# Patient Record
Sex: Female | Born: 1975 | Race: White | Hispanic: No | Marital: Single | Smoking: Never smoker
Health system: Southern US, Community
[De-identification: ages and names within clinical notes are randomized; demographics above are authoritative.]

---

## 2007-04-13 ENCOUNTER — Inpatient Hospital Stay (HOSPITAL_COMMUNITY): Admission: AD | Admit: 2007-04-13 | Discharge: 2007-04-13 | Payer: Self-pay | Admitting: Gynecology

## 2007-04-16 ENCOUNTER — Inpatient Hospital Stay (HOSPITAL_COMMUNITY): Admission: AD | Admit: 2007-04-16 | Discharge: 2007-04-16 | Payer: Self-pay | Admitting: Gynecology

## 2007-08-15 ENCOUNTER — Ambulatory Visit (HOSPITAL_COMMUNITY): Admission: RE | Admit: 2007-08-15 | Discharge: 2007-08-15 | Payer: Self-pay | Admitting: Family Medicine

## 2011-01-27 LAB — URINALYSIS, ROUTINE W REFLEX MICROSCOPIC
Ketones, ur: NEGATIVE
Specific Gravity, Urine: >1.03 — ABNORMAL HIGH
Urobilinogen, UA: 0.2
pH: 5.5

## 2011-01-27 LAB — WET PREP, GENITAL: Trich, Wet Prep: NONE SEEN

## 2020-08-16 ENCOUNTER — Other Ambulatory Visit: Payer: Self-pay

## 2020-08-16 ENCOUNTER — Inpatient Hospital Stay (HOSPITAL_COMMUNITY)
Admission: EM | Admit: 2020-08-16 | Discharge: 2020-08-21 | DRG: 417 | Disposition: A | Discharge status: Home / Self Care | Payer: BC Managed Care – PPO | Attending: Internal Medicine | Admitting: Internal Medicine

## 2020-08-16 ENCOUNTER — Emergency Department (HOSPITAL_COMMUNITY): Payer: BC Managed Care – PPO

## 2020-08-16 ENCOUNTER — Encounter (HOSPITAL_COMMUNITY): Payer: Self-pay | Admitting: Emergency Medicine

## 2020-08-16 DIAGNOSIS — J189 Pneumonia, unspecified organism: Secondary | ICD-10-CM

## 2020-08-16 DIAGNOSIS — K8000 Calculus of gallbladder with acute cholecystitis without obstruction: Secondary | ICD-10-CM | POA: Diagnosis not present

## 2020-08-16 DIAGNOSIS — K810 Acute cholecystitis: Secondary | ICD-10-CM | POA: Diagnosis not present

## 2020-08-16 DIAGNOSIS — K8012 Calculus of gallbladder with acute and chronic cholecystitis without obstruction: Secondary | ICD-10-CM | POA: Diagnosis not present

## 2020-08-16 DIAGNOSIS — J9811 Atelectasis: Secondary | ICD-10-CM | POA: Diagnosis not present

## 2020-08-16 DIAGNOSIS — R109 Unspecified abdominal pain: Secondary | ICD-10-CM | POA: Diagnosis not present

## 2020-08-16 DIAGNOSIS — Z20822 Contact with and (suspected) exposure to covid-19: Secondary | ICD-10-CM | POA: Diagnosis not present

## 2020-08-16 DIAGNOSIS — K219 Gastro-esophageal reflux disease without esophagitis: Secondary | ICD-10-CM | POA: Diagnosis present

## 2020-08-16 DIAGNOSIS — D72829 Elevated white blood cell count, unspecified: Secondary | ICD-10-CM

## 2020-08-16 DIAGNOSIS — K828 Other specified diseases of gallbladder: Secondary | ICD-10-CM | POA: Diagnosis present

## 2020-08-16 DIAGNOSIS — K8066 Calculus of gallbladder and bile duct with acute and chronic cholecystitis without obstruction: Secondary | ICD-10-CM | POA: Diagnosis not present

## 2020-08-16 DIAGNOSIS — K858 Other acute pancreatitis without necrosis or infection: Secondary | ICD-10-CM | POA: Diagnosis present

## 2020-08-16 DIAGNOSIS — K567 Ileus, unspecified: Secondary | ICD-10-CM | POA: Diagnosis not present

## 2020-08-16 DIAGNOSIS — K8020 Calculus of gallbladder without cholecystitis without obstruction: Secondary | ICD-10-CM

## 2020-08-16 DIAGNOSIS — K851 Biliary acute pancreatitis without necrosis or infection: Secondary | ICD-10-CM | POA: Diagnosis not present

## 2020-08-16 DIAGNOSIS — K859 Acute pancreatitis without necrosis or infection, unspecified: Secondary | ICD-10-CM | POA: Diagnosis not present

## 2020-08-16 DIAGNOSIS — K760 Fatty (change of) liver, not elsewhere classified: Secondary | ICD-10-CM | POA: Diagnosis not present

## 2020-08-16 DIAGNOSIS — E871 Hypo-osmolality and hyponatremia: Secondary | ICD-10-CM | POA: Diagnosis not present

## 2020-08-16 DIAGNOSIS — R935 Abnormal findings on diagnostic imaging of other abdominal regions, including retroperitoneum: Secondary | ICD-10-CM | POA: Diagnosis not present

## 2020-08-16 DIAGNOSIS — E876 Hypokalemia: Secondary | ICD-10-CM | POA: Diagnosis not present

## 2020-08-16 DIAGNOSIS — K8050 Calculus of bile duct without cholangitis or cholecystitis without obstruction: Secondary | ICD-10-CM | POA: Diagnosis not present

## 2020-08-16 DIAGNOSIS — D72828 Other elevated white blood cell count: Secondary | ICD-10-CM | POA: Diagnosis not present

## 2020-08-16 DIAGNOSIS — K3189 Other diseases of stomach and duodenum: Secondary | ICD-10-CM | POA: Diagnosis not present

## 2020-08-16 LAB — CBC WITH DIFFERENTIAL/PLATELET
Abs Immature Granulocytes: 0.09 10*3/uL — ABNORMAL HIGH (ref 0.00–0.07)
Basophils Absolute: 0 10*3/uL (ref 0.0–0.1)
Basophils Relative: 0 %
Eosinophils Absolute: 0 10*3/uL (ref 0.0–0.5)
Eosinophils Relative: 0 %
HCT: 41.3 % (ref 36.0–46.0)
Hemoglobin: 14.2 g/dL (ref 12.0–15.0)
Immature Granulocytes: 1 %
Lymphocytes Relative: 6 %
Lymphs Abs: 0.9 10*3/uL (ref 0.7–4.0)
MCH: 30.3 pg (ref 26.0–34.0)
MCHC: 34.4 g/dL (ref 30.0–36.0)
MCV: 88.2 fL (ref 80.0–100.0)
Monocytes Absolute: 0.6 10*3/uL (ref 0.1–1.0)
Monocytes Relative: 4 %
Neutro Abs: 13.1 10*3/uL — ABNORMAL HIGH (ref 1.7–7.7)
Neutrophils Relative %: 89 %
Platelets: 298 10*3/uL (ref 150–400)
RBC: 4.68 MIL/uL (ref 3.87–5.11)
RDW: 12.4 % (ref 11.5–15.5)
WBC: 14.7 10*3/uL — ABNORMAL HIGH (ref 4.0–10.5)
nRBC: 0 % (ref 0.0–0.2)

## 2020-08-16 LAB — COMPREHENSIVE METABOLIC PANEL
ALT: 451 U/L — ABNORMAL HIGH (ref 0–44)
AST: 254 U/L — ABNORMAL HIGH (ref 15–41)
Albumin: 4.3 g/dL (ref 3.5–5.0)
Alkaline Phosphatase: 178 U/L — ABNORMAL HIGH (ref 38–126)
Anion gap: 16 — ABNORMAL HIGH (ref 5–15)
BUN: 7 mg/dL (ref 6–20)
CO2: 22 mmol/L (ref 22–32)
Calcium: 9.2 mg/dL (ref 8.9–10.3)
Chloride: 96 mmol/L — ABNORMAL LOW (ref 98–111)
Creatinine, Ser: 0.72 mg/dL (ref 0.44–1.00)
GFR, Estimated: >60 mL/min (ref >60)
Glucose, Bld: 155 mg/dL — ABNORMAL HIGH (ref 70–99)
Potassium: 3.5 mmol/L (ref 3.5–5.1)
Sodium: 134 mmol/L — ABNORMAL LOW (ref 135–145)
Total Bilirubin: 6.8 mg/dL — ABNORMAL HIGH (ref 0.3–1.2)
Total Protein: 7.5 g/dL (ref 6.5–8.1)

## 2020-08-16 LAB — URINALYSIS, ROUTINE W REFLEX MICROSCOPIC
Glucose, UA: NEGATIVE mg/dL
Ketones, ur: 20 mg/dL — AB
Nitrite: NEGATIVE
Protein, ur: 30 mg/dL — AB
Specific Gravity, Urine: 1.019 (ref 1.005–1.030)
pH: 5 (ref 5.0–8.0)

## 2020-08-16 LAB — I-STAT BETA HCG BLOOD, ED (MC, WL, AP ONLY): I-stat hCG, quantitative: <5 m[IU]/mL (ref <5)

## 2020-08-16 LAB — LIPASE, BLOOD: Lipase: 4607 U/L — ABNORMAL HIGH (ref 11–51)

## 2020-08-16 MED ORDER — LACTATED RINGERS IV BOLUS
1000.0000 mL | Freq: Once | INTRAVENOUS | Status: AC
  Administered 2020-08-16: 1000 mL via INTRAVENOUS

## 2020-08-16 MED ORDER — PIPERACILLIN-TAZOBACTAM 3.375 G IVPB 30 MIN
3.3750 g | Freq: Once | INTRAVENOUS | Status: AC
  Administered 2020-08-16: 3.375 g via INTRAVENOUS
  Filled 2020-08-16: qty 50

## 2020-08-16 MED ORDER — IOHEXOL 300 MG/ML  SOLN
100.0000 mL | Freq: Once | INTRAMUSCULAR | Status: AC | PRN
  Administered 2020-08-16: 100 mL via INTRAVENOUS

## 2020-08-16 NOTE — H&P (Signed)
History and Physical    Kathleen Carney SNK:[id number] DOB: 1976/01/09 DOA: 08/16/2020  PCP: Pcp, No   Patient coming from: Home  Chief Complaint:  Nausea, vomiting, abdominal pain  HPI: Kathleen Carney is a 45 y.o. female with significant past medical history.  As for evaluation of abdominal pain with nausea and vomiting.  She woke up around 4 AM today with abdominal pain and nausea and vomiting.  Nausea and vomiting persist throughout the day with intermittent bouts of abdominal pain in the epigastric and right upper quadrant region.  She states she has been having abdominal pain similar to this since December 2021 but has been very intermittent and not as severe as it was today.  She has not had any fever or chills.  She denies diarrhea or constipation. She denies any urinary frequency or dysuria.  She denies any abdominal trauma or injuries. She denies tobacco, alcohol, illicit drug use.  ED Course: Kathleen Carney has been hemodynamically stable in the emergency room.  She was found to have gallstones and sludge in the gallbladder with thickening of the gallbladder wall with a stone in the gallbladder neck.  The common bile duct was mildly dilated a 8 to 10 mm.  Lipase is over 4000.  WBC 14,000.  Electrolytes unremarkable.  surgery was consulted and has seen patient.  Recommend gastroenterology evaluation in the morning for possible ERCP.  Surgery plans for laparoscopic cholecystectomy once pancreatitis improves.   Review of Systems:  General: Denies fever, chills, weight loss, night sweats.  Denies dizziness.  Denies change in appetite HENT: Denies head trauma, headache, denies change in hearing, tinnitus.  Denies nasal congestion or bleeding.  Denies sore throat, sores in mouth.  Denies difficulty swallowing Eyes: Denies blurry vision, pain in eye, drainage.  Denies discoloration of eyes. Neck: Denies pain.  Denies swelling.  Denies pain with movement. Cardiovascular: Denies chest pain, palpitations.   Denies edema.  Denies orthopnea Respiratory: Denies shortness of breath, cough.  Denies wheezing.  Denies sputum production Gastrointestinal: Reports abdominal pain. Reports nausea, vomiting. Denies diarrhea.  Denies melena.  Denies hematemesis. Musculoskeletal: Denies limitation of movement.  Denies deformity or swelling.  Denies pain.  Denies arthralgias or myalgias. Genitourinary: Denies pelvic pain.  Denies urinary frequency or hesitancy.  Denies dysuria.  Skin: Denies rash.  Denies petechiae, purpura, ecchymosis. Neurological: Denies syncope.  Denies seizure activity.  Denies weakness or paresthesia.  Denies slurred speech, drooping face.  Denies visual change. Psychiatric: Denies depression, anxiety.  Denies hallucinations.  History reviewed. No pertinent past medical history.  History reviewed. No pertinent surgical history.  Social History  reports that she has never smoked. She has never used smokeless tobacco. She reports previous alcohol use. She reports previous drug use.  Not on File  History reviewed. No pertinent family history.   Prior to Admission medications   Not on File    Physical Exam: Vitals:   08/16/20 2045 08/16/20 2100 08/16/20 2115 08/16/20 2203  BP:  133/71  (!) 144/87  Pulse: (!) 57 (!) 57 (!) 57 (!) 59  Resp: 19 19 16 10   Temp:      TempSrc:      SpO2: 96% 97% 96% 99%    Constitutional: NAD, calm, comfortable Vitals:   08/16/20 2045 08/16/20 2100 08/16/20 2115 08/16/20 2203  BP:  133/71  (!) 144/87  Pulse: (!) 57 (!) 57 (!) 57 (!) 59  Resp: 19 19 16 10   Temp:      TempSrc:  SpO2: 96% 97% 96% 99%   General: WDWN, Alert and oriented x3.  Eyes: EOMI, PERRL, conjunctivae normal.  Sclera nonicteric HENT:  [place]/AT, external ears normal.  Nares patent without epistasis.  Mucous membranes are dry. Posterior pharynx clear of any exudate or lesions. Normal dentition.  Neck: Soft, normal range of motion, supple, no masses, no thyromegaly.  Trachea  midline Respiratory: clear to auscultation bilaterally, no wheezing, no crackles. Normal respiratory effort. No accessory muscle use.  Cardiovascular: Regular rate and rhythm, no murmurs / rubs / gallops. No extremity edema. 2+ pedal pulses.  Abdomen: Soft, RUQ tenderness, nondistended. Positive Murphy's sign.  no rebound or guarding.  No masses palpated. No hepatosplenomegaly. Bowel sounds normoactive Musculoskeletal: FROM. no cyanosis. No joint deformity upper and lower extremities. Normal muscle tone.  Skin: Warm, dry, intact no rashes, lesions, ulcers. No induration Neurologic: CN 2-12 grossly intact. Normal speech. Sensation intact, patella DTR +1 bilaterally. Strength 5/5 in all extremities.   Psychiatric: Normal judgment and insight.  Normal mood.    Labs on Admission: I have personally reviewed following labs and imaging studies  CBC: Recent Labs  Lab 08/16/20 1801  WBC 14.7*  NEUTROABS 13.1*  HGB 14.2  HCT 41.3  MCV 88.2  PLT 298    Basic Metabolic Panel: Recent Labs  Lab 08/16/20 1801  NA 134*  K 3.5  CL 96*  CO2 22  GLUCOSE 155*  BUN 7  CREATININE 0.72  CALCIUM 9.2    GFR: CrCl cannot be calculated (Unknown ideal weight.).  Liver Function Tests: Recent Labs  Lab 08/16/20 1801  AST 254*  ALT 451*  ALKPHOS 178*  BILITOT 6.8*  PROT 7.5  ALBUMIN 4.3    Urine analysis:    Component Value Date/Time   COLORURINE AMBER (A) 08/16/2020 1936   APPEARANCEUR CLOUDY (A) 08/16/2020 1936   LABSPEC 1.019 08/16/2020 1936   PHURINE 5.0 08/16/2020 1936   GLUCOSEU NEGATIVE 08/16/2020 1936   HGBUR SMALL (A) 08/16/2020 1936   BILIRUBINUR MODERATE (A) 08/16/2020 1936   KETONESUR 20 (A) 08/16/2020 1936   PROTEINUR 30 (A) 08/16/2020 1936   UROBILINOGEN 0.2 04/13/2007 1434   NITRITE NEGATIVE 08/16/2020 1936   LEUKOCYTESUR TRACE (A) 08/16/2020 1936    Radiological Exams on Admission: US Abdomen Limited RUQ (LIVER/GB)  Result Date: 08/16/2020 CLINICAL DATA:   Gallbladder colic.  Elevated LFTs. EXAM: ULTRASOUND ABDOMEN LIMITED RIGHT UPPER QUADRANT COMPARISON:  None. FINDINGS: Gallbladder: Distended containing intraluminal sludge and stones, as well as non mobile gallstones in the gallbladder neck. Borderline wall thickness of 3 mm. Trace pericholecystic fluid. No sonographic Murphy sign noted by sonographer. Common bile duct: Diameter: 8-10 mm, no visualized choledocholithiasis. Liver: Heterogeneous and mildly increased compared to right kidney. Probable geographic areas of steatosis. No discrete focal lesion. Portal vein is patent on color Doppler imaging with normal direction of blood flow towards the liver. Other: None. IMPRESSION: 1. Distended gallbladder containing sludge and stones, as well as a non mobile stone in the gallbladder neck. Borderline wall thickness of 3 mm with trace pericholecystic fluid. Findings may represent acute cholecystitis in the appropriate clinical setting. 2. Dilated common bile duct at 8-10 mm, no visualized choledocholithiasis. 3. Heterogeneous increased hepatic parenchymal echogenicity most consistent with steatosis. Electronically Signed   By: Narda Rutherford M.D.   On: 08/16/2020 21:00     Assessment/Plan Principal Problem:   Acute gallstone pancreatitis Kathleen Carney is admitted to Med/Surg floor.  IVF hydration with LR at 125 ml/hr Pain control with IV  dilaudid as needed for moderate to severe pain.  Surgery has been consulted and planning for laproscopic cholecystectomy once pancreatitis improves.  Consult GI in am for possible ERCP.   Active Problems:   Acute cholecystitis Surgery following as above.  Zosyn IV     Leukocytosis Monitor CBC    DVT prophylaxis: Padua score low, TED hose and early ambulation for DVT prophylaxis. Code Status:   Full Code  Family Communication:  Diagnosis and plan discussed with patient.  Patient verbalized understanding agrees with plan.  Further recommendations to follow as  clinically indicated Disposition Plan:   Patient is from:  Home  Anticipated DC to:  Home  Anticipated DC date:  Anticipate 2 midnight or more stay in the hospital  Anticipated DC barriers: Barriers to discharge identified at this time  Consults called:  Surgery, Dr. Janee Morn Admission status:  Inpatient  Claudean Severance Yoshito Gaza MD [facility]  How to contact the [facility] Attending or Consulting provider 7A - 7P or covering provider during after hours 7P -7A, for this patient?   1. Check the care team in [facility] and look for a) attending/consulting [facility] provider listed and b) the [facility] team listed 2. Log into [email or web address] and use [place] universal password to access. If you do not have the password, please contact the hospital operator. 3. Locate the [facility] provider you are looking for under [facility] and page to a number that you can be directly reached. 4. If you still have difficulty reaching the provider, please page the [facility] (Director on Call) for the Hospitalists listed on amion for assistance.  08/16/2020, 10:49 PM

## 2020-08-16 NOTE — ED Provider Notes (Signed)
[facility] EMERGENCY DEPARTMENT Provider Note   CSN: [id number] Arrival date & time: 08/16/20  1755     History Chief Complaint  Patient presents with  . Abdominal Pain  . Nausea    Kathleen Carney is a 45 y.o. female.  Pt complains of diffuse abdominal pain that started at 4AM. Pain associated with nausea and vomiting. Admits to chills, but denies fever. No urinary or vaginal symptoms. No previous abdominal operations.  She states she had similar pain before in December about 4 to 5 months ago that resolved by itself at the time.  She denies any alcohol use.  States that the pain is worse in the mid abdominal region and the left lower quadrant region.        History reviewed. No pertinent past medical history.  There are no problems to display for this patient.   History reviewed. No pertinent surgical history.   OB History   No obstetric history on file.     No family history on file.  Social History   Tobacco Use  . Smoking status: Never Smoker  . Smokeless tobacco: Never Used  Substance Use Topics  . Alcohol use: Not Currently  . Drug use: Not Currently    Home Medications Prior to Admission medications   Not on File    Allergies    Patient has no allergy information on record.  Review of Systems   Review of Systems  Constitutional: Negative for fever.  HENT: Negative for ear pain.   Eyes: Negative for pain.  Respiratory: Negative for cough.   Cardiovascular: Negative for chest pain.  Gastrointestinal: Positive for abdominal pain.  Genitourinary: Negative for flank pain.  Musculoskeletal: Negative for back pain.  Skin: Negative for rash.  Neurological: Negative for headaches.    Physical Exam Updated Vital Signs BP (!) 134/104   Pulse (!) 57   Temp 97.7 F (36.5 C) (Oral)   Resp 19   LMP 08/15/2020   SpO2 97%   Physical Exam Constitutional:      General: She is not in acute distress.    Appearance: Normal  appearance.  HENT:     Head: Normocephalic.     Nose: Nose normal.  Eyes:     Extraocular Movements: Extraocular movements intact.  Cardiovascular:     Rate and Rhythm: Normal rate.  Pulmonary:     Effort: Pulmonary effort is normal.  Abdominal:     Tenderness: There is abdominal tenderness in the periumbilical area and left lower quadrant.  Musculoskeletal:        General: Normal range of motion.     Cervical back: Normal range of motion.  Neurological:     General: No focal deficit present.     Mental Status: She is alert. Mental status is at baseline.     ED Results / Procedures / Treatments   Labs (all labs ordered are listed, but only abnormal results are displayed) Labs Reviewed  CBC WITH DIFFERENTIAL/PLATELET - Abnormal; Notable for the following components:      Result Value   WBC 14.7 (*)    Neutro Abs 13.1 (*)    Abs Immature Granulocytes 0.09 (*)    All other components within normal limits  COMPREHENSIVE METABOLIC PANEL - Abnormal; Notable for the following components:   Sodium 134 (*)    Chloride 96 (*)    Glucose, Bld 155 (*)    AST 254 (*)    ALT 451 (*)  Alkaline Phosphatase 178 (*)    Total Bilirubin 6.8 (*)    Anion gap 16 (*)    All other components within normal limits  LIPASE, BLOOD - Abnormal; Notable for the following components:   Lipase 4,607 (*)    All other components within normal limits  URINALYSIS, ROUTINE W REFLEX MICROSCOPIC - Abnormal; Notable for the following components:   Color, Urine AMBER (*)    APPearance CLOUDY (*)    Hgb urine dipstick SMALL (*)    Bilirubin Urine MODERATE (*)    Ketones, ur 20 (*)    Protein, ur 30 (*)    Leukocytes,Ua TRACE (*)    Bacteria, UA RARE (*)    All other components within normal limits  I-STAT BETA HCG BLOOD, ED ([facility], [facility], [facility] ONLY)    EKG None  Radiology US Abdomen Limited RUQ (LIVER/GB)  Result Date: 08/16/2020 CLINICAL DATA:  Gallbladder colic.  Elevated LFTs. EXAM: ULTRASOUND  ABDOMEN LIMITED RIGHT UPPER QUADRANT COMPARISON:  None. FINDINGS: Gallbladder: Distended containing intraluminal sludge and stones, as well as non mobile gallstones in the gallbladder neck. Borderline wall thickness of 3 mm. Trace pericholecystic fluid. No sonographic Murphy sign noted by sonographer. Common bile duct: Diameter: 8-10 mm, no visualized choledocholithiasis. Liver: Heterogeneous and mildly increased compared to right kidney. Probable geographic areas of steatosis. No discrete focal lesion. Portal vein is patent on color Doppler imaging with normal direction of blood flow towards the liver. Other: None. IMPRESSION: 1. Distended gallbladder containing sludge and stones, as well as a non mobile stone in the gallbladder neck. Borderline wall thickness of 3 mm with trace pericholecystic fluid. Findings may represent acute cholecystitis in the appropriate clinical setting. 2. Dilated common bile duct at 8-10 mm, no visualized choledocholithiasis. 3. Heterogeneous increased hepatic parenchymal echogenicity most consistent with steatosis. Electronically Signed   By: Narda Rutherford M.D.   On: 08/16/2020 21:00    Procedures Procedures   Medications Ordered in ED Medications  piperacillin-tazobactam (ZOSYN) IVPB 3.375 g (3.375 g Intravenous New Bag/Given 08/16/20 2121)  lactated ringers bolus 1,000 mL (has no administration in time range)    ED Course  I have reviewed the triage vital signs and the nursing notes.  Pertinent labs & imaging results that were available during my care of the patient were reviewed by me and considered in my medical decision making (see chart for details).    MDM Rules/Calculators/A&P                          Labs returned showing elevated white count of 14, chemistry shows elevated liver enzymes and T bili 6.8.  At this point ultrasounds pursue with findings concerning for acute cholecystitis with common bile duct dilatation.  Lipase also returned greater than  4000.  Patient given IV fluid hydration, declines pain medications at this time.  Started on Zosyn.  Case discussed with on-call surgery who will see the patient.  Hospitalist consulted for admission.  Final Clinical Impression(s) / ED Diagnoses Final diagnoses:  Gallbladder colic  Gallstone pancreatitis    Rx / DC Orders ED Discharge Orders    None       Cheryll Cockayne, MD 08/16/20 2127

## 2020-08-16 NOTE — ED Triage Notes (Signed)
Emergency Medicine Provider Triage Evaluation Note  Kathleen Carney , a 45 y.o. female  was evaluated in triage.  Pt complains of diffuse abdominal pain that started at 4AM. Pain associated with nausea and vomiting. Admits to chills, but denies fever. No urinary or vaginal symptoms. No previous abdominal operations.   Review of Systems  Positive: Abdominal pain Negative: fever  Physical Exam  BP 117/78 (BP Location: Right Arm)   Pulse 71   Temp 97.6 F (36.4 C) (Oral)   Resp 18   SpO2 97%  Gen:   Awake, no distress   HEENT:  Atraumatic  Resp:  Normal effort  Cardiac:  Normal rate  Abd:   Nondistended, diffuse abdominal tenderness MSK:   Moves extremities without difficulty  Neuro:  Speech clear   Medical Decision Making  Medically screening exam initiated at 6:02 PM.  Appropriate orders placed.  Kathleen Carney was informed that the remainder of the evaluation will be completed by another provider, this initial triage assessment does not replace that evaluation, and the importance of remaining in the ED until their evaluation is complete.  Clinical Impression  Abdominal pain. Labs ordered.   Kathleen Carney, [place] 08/16/20 [phone]

## 2020-08-16 NOTE — ED Notes (Signed)
US at bedside

## 2020-08-16 NOTE — Consult Note (Signed)
Reason for Consult:biliary pancreatitis Referring Physician: [name] is an 45 y.o. female.  HPI: 45yo F was a history of right upper quadrant pain attacks x2 since January presented to the emergency department with epigastric and right upper quadrant pain associated with vomiting starting at 4 AM today.  She underwent evaluation in the emergency department.  Right upper quadrant ultrasound shows a gallbladder with sludge and stones, there is a stone in the neck of the gallbladder, lipase 4607 and bili 6.8.  I was asked to consult.  She continues to have some epigastric pain and nausea.  History reviewed. No pertinent past medical history.  History reviewed. No pertinent surgical history.  No family history on file.  Social History:  reports that she has never smoked. She has never used smokeless tobacco. She reports previous alcohol use. She reports previous drug use.  Allergies: Not on File  Medications: I have reviewed the patient's current medications.  Results for orders placed or performed during the hospital encounter of 08/16/20 (from the past 48 hour(s))  CBC with Differential     Status: Abnormal   Collection Time: 08/16/20  6:01 PM  Result Value Ref Range   WBC 14.7 (H) 4.0 - 10.5 K/uL   RBC 4.68 3.87 - 5.11 MIL/uL   Hemoglobin 14.2 12.0 - 15.0 g/dL   HCT 78.2 42.3 - 53.6 %   MCV 88.2 80.0 - 100.0 fL   MCH 30.3 26.0 - 34.0 pg   MCHC 34.4 30.0 - 36.0 g/dL   RDW 14.4 31.5 - 40.0 %   Platelets 298 150 - 400 K/uL   nRBC 0.0 0.0 - 0.2 %   Neutrophils Relative % 89 %   Neutro Abs 13.1 (H) 1.7 - 7.7 K/uL   Lymphocytes Relative 6 %   Lymphs Abs 0.9 0.7 - 4.0 K/uL   Monocytes Relative 4 %   Monocytes Absolute 0.6 0.1 - 1.0 K/uL   Eosinophils Relative 0 %   Eosinophils Absolute 0.0 0.0 - 0.5 K/uL   Basophils Relative 0 %   Basophils Absolute 0.0 0.0 - 0.1 K/uL   Immature Granulocytes 1 %   Abs Immature Granulocytes 0.09 (H) 0.00 - 0.07 K/uL    Comment: Performed  at [facility], [address] [place], [place] [address]  Comprehensive metabolic panel     Status: Abnormal   Collection Time: 08/16/20  6:01 PM  Result Value Ref Range   Sodium 134 (L) 135 - 145 mmol/L   Potassium 3.5 3.5 - 5.1 mmol/L   Chloride 96 (L) 98 - 111 mmol/L   CO2 22 22 - 32 mmol/L   Glucose, Bld 155 (H) 70 - 99 mg/dL    Comment: Glucose reference range applies only to samples taken after fasting for at least 8 hours.   BUN 7 6 - 20 mg/dL   Creatinine, Ser 9.50 0.44 - 1.00 mg/dL   Calcium 9.2 8.9 - 93.2 mg/dL   Total Protein 7.5 6.5 - 8.1 g/dL   Albumin 4.3 3.5 - 5.0 g/dL   AST 671 (H) 15 - 41 U/L   ALT 451 (H) 0 - 44 U/L   Alkaline Phosphatase 178 (H) 38 - 126 U/L   Total Bilirubin 6.8 (H) 0.3 - 1.2 mg/dL   GFR, Estimated >24 >58 mL/min    Comment: (NOTE) Calculated using the CKD-EPI Creatinine Equation (2021)    Anion gap 16 (H) 5 - 15    Comment: Performed at [facility], [address] [place], [place] [address]  Lipase, blood     Status: Abnormal   Collection Time: 08/16/20  6:01 PM  Result Value Ref Range   Lipase 4,607 (H) 11 - 51 U/L    Comment: RESULTS CONFIRMED BY MANUAL DILUTION Performed at [facility], [address], [place], [place] [address]   I-Stat Beta hCG blood, ED ([facility], [facility], [facility] only)     Status: None   Collection Time: 08/16/20  6:36 PM  Result Value Ref Range   I-stat hCG, quantitative <5.0 <5 mIU/mL   Comment 3            Comment:   GEST. AGE      CONC.  (mIU/mL)   <=1 WEEK        5 - 50     2 WEEKS       50 - 500     3 WEEKS       100 - 10,000     4 WEEKS     1,000 - 30,000        FEMALE AND NON-PREGNANT FEMALE:     LESS THAN 5 mIU/mL   Urinalysis, Routine w reflex microscopic Urine, Clean Catch     Status: Abnormal   Collection Time: 08/16/20  7:36 PM  Result Value Ref Range   Color, Urine AMBER (A) YELLOW    Comment: BIOCHEMICALS MAY BE AFFECTED BY COLOR   APPearance CLOUDY (A) CLEAR   Specific Gravity,  Urine 1.019 1.005 - 1.030   pH 5.0 5.0 - 8.0   Glucose, UA NEGATIVE NEGATIVE mg/dL   Hgb urine dipstick SMALL (A) NEGATIVE   Bilirubin Urine MODERATE (A) NEGATIVE   Ketones, ur 20 (A) NEGATIVE mg/dL   Protein, ur 30 (A) NEGATIVE mg/dL   Nitrite NEGATIVE NEGATIVE   Leukocytes,Ua TRACE (A) NEGATIVE   RBC / HPF 0-5 0 - 5 RBC/hpf   WBC, UA 6-10 0 - 5 WBC/hpf   Bacteria, UA RARE (A) NONE SEEN   Squamous Epithelial / LPF 6-10 0 - 5   Mucus PRESENT    Hyaline Casts, UA PRESENT     Comment: Performed at [facility], [address] [place], [place] [address]    US Abdomen Limited RUQ (LIVER/GB)  Result Date: 08/16/2020 CLINICAL DATA:  Gallbladder colic.  Elevated LFTs. EXAM: ULTRASOUND ABDOMEN LIMITED RIGHT UPPER QUADRANT COMPARISON:  None. FINDINGS: Gallbladder: Distended containing intraluminal sludge and stones, as well as non mobile gallstones in the gallbladder neck. Borderline wall thickness of 3 mm. Trace pericholecystic fluid. No sonographic Murphy sign noted by sonographer. Common bile duct: Diameter: 8-10 mm, no visualized choledocholithiasis. Liver: Heterogeneous and mildly increased compared to right kidney. Probable geographic areas of steatosis. No discrete focal lesion. Portal vein is patent on color Doppler imaging with normal direction of blood flow towards the liver. Other: None. IMPRESSION: 1. Distended gallbladder containing sludge and stones, as well as a non mobile stone in the gallbladder neck. Borderline wall thickness of 3 mm with trace pericholecystic fluid. Findings may represent acute cholecystitis in the appropriate clinical setting. 2. Dilated common bile duct at 8-10 mm, no visualized choledocholithiasis. 3. Heterogeneous increased hepatic parenchymal echogenicity most consistent with steatosis. Electronically Signed   By: Narda Rutherford M.D.   On: 08/16/2020 21:00    Review of Systems  Constitutional: Positive for activity change.  HENT: Negative.   Eyes:  Negative.   Respiratory: Negative for chest tightness and  shortness of breath.   Cardiovascular: Negative for chest pain.  Gastrointestinal: Positive for abdominal pain, nausea and vomiting. Negative for diarrhea.  Endocrine: Negative.   Genitourinary: Negative.  Negative for difficulty urinating.  Musculoskeletal: Negative.   Allergic/Immunologic: Negative.   Neurological: Negative.   Hematological: Negative.   Psychiatric/Behavioral: Negative.    Blood pressure (!) 144/87, pulse (!) 59, temperature 97.7 F (36.5 C), temperature source Oral, resp. rate 10, last menstrual period 08/15/2020, SpO2 99 %. Physical Exam Constitutional:      Appearance: She is well-developed.  HENT:     Head: Normocephalic.  Eyes:     Extraocular Movements: Extraocular movements intact.     Pupils: Pupils are equal, round, and reactive to light.  Cardiovascular:     Rate and Rhythm: Normal rate and regular rhythm.     Heart sounds: Normal heart sounds.  Pulmonary:     Effort: Pulmonary effort is normal.     Breath sounds: Normal breath sounds. No wheezing or rales.  Abdominal:     General: Abdomen is flat. Bowel sounds are normal. There is no distension.     Palpations: Abdomen is soft. There is no mass.     Tenderness: There is abdominal tenderness in the right upper quadrant. There is no guarding or rebound.  Skin:    General: Skin is warm and dry.     Capillary Refill: Capillary refill takes 2 to 3 seconds.  Neurological:     Mental Status: She is alert.  Psychiatric:        Mood and Affect: Mood normal.     Assessment/Plan: Biliary pancreatitis -agree with medical admission and GI consultation.  We will plan laparoscopic cholecystectomy when pancreatitis improves.  We will follow along.  I discussed the disease process with her and answered her questions.  Liz Malady 08/16/2020, 10:37 PM

## 2020-08-16 NOTE — ED Triage Notes (Signed)
Pt. Stated, Kathleen Carney had N/V  And bad abdominal pain that started this morning around 400

## 2020-08-16 NOTE — ED Notes (Signed)
Patient transported to CT 

## 2020-08-17 ENCOUNTER — Inpatient Hospital Stay (HOSPITAL_COMMUNITY): Payer: BC Managed Care – PPO

## 2020-08-17 ENCOUNTER — Other Ambulatory Visit: Payer: Self-pay

## 2020-08-17 DIAGNOSIS — D72829 Elevated white blood cell count, unspecified: Secondary | ICD-10-CM

## 2020-08-17 DIAGNOSIS — K810 Acute cholecystitis: Secondary | ICD-10-CM

## 2020-08-17 DIAGNOSIS — K851 Biliary acute pancreatitis without necrosis or infection: Secondary | ICD-10-CM

## 2020-08-17 DIAGNOSIS — R935 Abnormal findings on diagnostic imaging of other abdominal regions, including retroperitoneum: Secondary | ICD-10-CM

## 2020-08-17 LAB — COMPREHENSIVE METABOLIC PANEL
ALT: 337 U/L — ABNORMAL HIGH (ref 0–44)
AST: 169 U/L — ABNORMAL HIGH (ref 15–41)
Albumin: 3.5 g/dL (ref 3.5–5.0)
Alkaline Phosphatase: 147 U/L — ABNORMAL HIGH (ref 38–126)
Anion gap: 9 (ref 5–15)
BUN: 5 mg/dL — ABNORMAL LOW (ref 6–20)
CO2: 25 mmol/L (ref 22–32)
Calcium: 8.6 mg/dL — ABNORMAL LOW (ref 8.9–10.3)
Chloride: 100 mmol/L (ref 98–111)
Creatinine, Ser: 0.67 mg/dL (ref 0.44–1.00)
GFR, Estimated: >60 mL/min (ref >60)
Glucose, Bld: 110 mg/dL — ABNORMAL HIGH (ref 70–99)
Potassium: 3.3 mmol/L — ABNORMAL LOW (ref 3.5–5.1)
Sodium: 134 mmol/L — ABNORMAL LOW (ref 135–145)
Total Bilirubin: 3 mg/dL — ABNORMAL HIGH (ref 0.3–1.2)
Total Protein: 6.4 g/dL — ABNORMAL LOW (ref 6.5–8.1)

## 2020-08-17 LAB — HIV ANTIBODY (ROUTINE TESTING W REFLEX): HIV Screen 4th Generation wRfx: NONREACTIVE

## 2020-08-17 LAB — CBC
HCT: 38 % (ref 36.0–46.0)
Hemoglobin: 12.8 g/dL (ref 12.0–15.0)
MCH: 30.3 pg (ref 26.0–34.0)
MCHC: 33.7 g/dL (ref 30.0–36.0)
MCV: 89.8 fL (ref 80.0–100.0)
Platelets: 207 10*3/uL (ref 150–400)
RBC: 4.23 MIL/uL (ref 3.87–5.11)
RDW: 12.4 % (ref 11.5–15.5)
WBC: 13.4 10*3/uL — ABNORMAL HIGH (ref 4.0–10.5)
nRBC: 0 % (ref 0.0–0.2)

## 2020-08-17 LAB — SARS CORONAVIRUS 2 (TAT 6-24 HRS): SARS Coronavirus 2: NEGATIVE

## 2020-08-17 LAB — LIPASE, BLOOD: Lipase: 1494 U/L — ABNORMAL HIGH (ref 11–51)

## 2020-08-17 MED ORDER — HYDROMORPHONE HCL 1 MG/ML IJ SOLN
1.0000 mg | INTRAMUSCULAR | Status: DC | PRN
  Administered 2020-08-17 – 2020-08-20 (×15): 1 mg via INTRAVENOUS
  Filled 2020-08-17 (×15): qty 1

## 2020-08-17 MED ORDER — LACTATED RINGERS IV SOLN: INTRAVENOUS | Status: DC

## 2020-08-17 MED ORDER — PIPERACILLIN-TAZOBACTAM 3.375 G IVPB 30 MIN
3.3750 g | Freq: Three times a day (TID) | INTRAVENOUS | Status: DC
  Administered 2020-08-17: 3.375 g via INTRAVENOUS
  Filled 2020-08-17: qty 50

## 2020-08-17 MED ORDER — GADOBUTROL 1 MMOL/ML IV SOLN
7.0000 mL | Freq: Once | INTRAVENOUS | Status: AC | PRN
  Administered 2020-08-17: 7 mL via INTRAVENOUS

## 2020-08-17 MED ORDER — ONDANSETRON HCL 4 MG/2ML IJ SOLN
4.0000 mg | Freq: Four times a day (QID) | INTRAMUSCULAR | Status: DC | PRN
  Administered 2020-08-17 – 2020-08-20 (×2): 4 mg via INTRAVENOUS
  Filled 2020-08-17: qty 2

## 2020-08-17 MED ORDER — ONDANSETRON HCL 4 MG PO TABS: 4.0000 mg | ORAL_TABLET | Freq: Four times a day (QID) | ORAL | Status: DC | PRN

## 2020-08-17 MED ORDER — PIPERACILLIN-TAZOBACTAM 3.375 G IVPB
3.3750 g | Freq: Three times a day (TID) | INTRAVENOUS | Status: DC
  Filled 2020-08-17: qty 50

## 2020-08-17 NOTE — Consult Note (Addendum)
[facility] Consult: 8:35 AM 08/17/2020  LOS: 1 day    Referring Provider: Dr Tonie Griffith  Primary Care Physician:  Pcp, No Primary Gastroenterologist:  None, unassigned     Reason for Consultation: Acute pancreatitis.   HPI: Kathleen Carney is a 45 y.o. female.  Medical history is unremarkable.  Presented to ED and admitted to the hospital yesterday.  Has had attacks of right upper quadrant/epigastric pain on a few occasions starting in December/January.  Had another attack early yesterday morning associated with nonbloody vomiting.  Woke up at 4 AM with bilious, partially digested food emesis and right upper quadrant pain.  The pain was ebbing and flowing from a scale of 4 up to 8 out of 10.  She went to work, as a Financial planner.  By 2 PM the pain was too severe and she went home.  By 4:00 she was at the ED.  Had intermittent vomit probably about 2 or 3 episodes in total.  At times pain has radiated up into her central chest area  Ultrasound reveals gallbladder sludge and stones, stone in neck of gallbladder.  8 to 10 mm CBD, no choledocholithiasis.  Hepatic steatosis. CTAP w contrast: Acute, interstitial pancreatitis.  No fluid collections, necrosis.  PD not dilated.  Thickened wall and distention in gallbladder with pericholecystic fluid suspicious for acute cholecystitis.  CBD upper normal at 6 mm.  No intrahepatic biliary ductal dilatation.  Mild wall thickening and submucosal enhancement, adjacent inflammatory stranding in the first and second duodenum, likely reactive.  Diffuse hepatic steatosis. Lipase 07/28/2005. T bili 6.8 >> 3.  Alk phos 178 >> 147.  AST/ALT 254/451 >> 169/337.  Preserved renal function.  No major electrolyte abnormalities. WBCs 14.7.  Not anemic. COVID-19 negative.    Patient does not  drink alcohol and never had history of heavy drinking.  No family history of gallbladder disease, pancreatitis but she is adopted.  She has some vague information that somebody in her biologic family may have had colon cancer but she has no details.    History reviewed. No pertinent past medical history.  History reviewed. No pertinent surgical history.  Prior to Admission medications   Not on File    Scheduled Meds:  Infusions: . lactated ringers    . piperacillin-tazobactam     PRN Meds: HYDROmorphone (DILAUDID) injection, ondansetron **OR** ondansetron (ZOFRAN) IV   Allergies as of 08/16/2020  . (Not on File)    History reviewed. No pertinent family history.  Social History   Socioeconomic History  . Marital status: Single    Spouse name: Not on file  . Number of children: Not on file  . Years of education: Not on file  . Highest education level: Not on file  Occupational History  . Not on file  Tobacco Use  . Smoking status: Never Smoker  . Smokeless tobacco: Never Used  Substance and Sexual Activity  . Alcohol use: Not Currently  . Drug use: Not Currently  . Sexual activity: Not on file  Other Topics Concern  . Not on  file  Social History Narrative  . 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  Its been many years since she had a GYN exam Constitutional: Some fatigue, not profound.  No weakness. ENT:  No nose bleeds Pulm: Pain in the abdomen triggered by deep breathing so sort of short of breath but this is not dyspnea on exertion. CV:  No  palpitations, no LE edema.  No angina. GU:  No hematuria, no frequency GI: See HPI Heme: No unusual bleeding or bruising. Transfusions: None. Neuro:  No headaches, no peripheral tingling or numbness Derm:  No itching, no rash or sores.  Endocrine:  No sweats or chills.  No polyuria or dysuria Immunization: Not queried.    PHYSICAL EXAM: Vital signs in last 24 hours: Vitals:   08/17/20 0600 08/17/20 0724  BP: 120/84 129/86  Pulse: 95 (!) 102  Resp: 20 16  Temp:  98.7 F (37.1 C)  SpO2: 98% 95%   Wt Readings from Last 3 Encounters:  08/17/20 68.9 kg    General: Pleasant, looks well.  Resting comfortably in bed.  Alert and provides good history. Head: No facial asymmetry or swelling.  No signs of head trauma. Eyes: No scleral icterus Ears: Not hard of hearing Nose: No congestion or discharge Mouth: Good dentition.  Tongue midline.  Moist, pink, clear mucosa. Neck: No JVD, no masses, no thyromegaly Lungs: Excellent breath sounds, clear bilaterally.  No cough or labored breathing Heart: RRR.  No MRG.  S1, S2 present Abdomen: Soft.  Mil tenderness in the right upper quadrant/epigastric region but received injection of Dilaudid about 30 minutes ago.  Active bowel sounds.  No distention.  No organomegaly.  No hernias or bruits. Rectal: Deferred Musc/Skeltl: No joint redness, swelling or gross deformity Extremities: No CCE Neurologic: Oriented x3.  Moves all 4 limbs without weakness or tremor. Skin: No jaundice.   Psych: Pleasant, calm, in good spirits.  Intake/Output from previous day: 04/25 0701 - 04/26 0700 In: 2100 [IV Piggyback:2100] Out: -  Intake/Output this shift: No intake/output data recorded.  LAB RESULTS: Recent Labs    08/16/20 1801 08/17/20 0645  WBC 14.7* 13.4*  HGB 14.2 12.8  HCT 41.3 38.0  PLT 298 207   BMET Lab Results  Component Value Date   NA 134 (L) 08/16/2020   K 3.5 08/16/2020   CL 96 (L) 08/16/2020   CO2 22 08/16/2020   GLUCOSE 155  (H) 08/16/2020   BUN 7 08/16/2020   CREATININE 0.72 08/16/2020   CALCIUM 9.2 08/16/2020   LFT Recent Labs    08/16/20 1801  PROT 7.5  ALBUMIN 4.3  AST 254*  ALT 451*  ALKPHOS 178*  BILITOT 6.8*   PT/INR No results found for: INR, PROTIME Hepatitis Panel No results for input(s): HEPBSAG, HCVAB, HEPAIGM, HEPBIGM in the last 72 hours. C-Diff No components found for: CDIFF Lipase     Component Value Date/Time   LIPASE 4,607 (H) 08/16/2020 1801    Drugs of Abuse  No results found for: LABOPIA, COCAINSCRNUR, LABBENZ,  AMPHETMU, THCU, LABBARB   RADIOLOGY STUDIES: CT Abdomen Pelvis W Contrast  Result Date: 08/16/2020 CLINICAL DATA:  Abdominal pain since this a.m. EXAM: CT ABDOMEN AND PELVIS WITH CONTRAST TECHNIQUE: Multidetector CT imaging of the abdomen and pelvis was performed using the standard protocol following bolus administration of intravenous contrast. CONTRAST:  129m OMNIPAQUE IOHEXOL 300 MG/ML  SOLN COMPARISON:  Same day abdominal ultrasound FINDINGS: Lower chest: Dependent atelectasis. Normal size heart. No significant pericardial effusion/thickening. Hepatobiliary: Diffuse hepatic steatosis. No suspicious hepatic lesion. Gallbladder is distended with wall thickening and pericholecystic fluid. The common duct measures upper limits of normal at 6 mm. No radiopaque choledocholithiasis visualized. Pancreas: Edematous appearance of the pancreatic parenchyma with peripancreatic stranding. No pancreatic ductal dilation. No focal areas of pancreatic nonenhancement. No walled off collections. Spleen: Normal in size without focal abnormality. Adrenals/Urinary Tract: Adrenal glands are unremarkable. No hydronephrosis. Tiny hypodense bilateral renal lesions which are technically too small to accurately characterize but favored represent cysts. No solid enhancing renal lesion. Bladder is unremarkable for degree of distension. Stomach/Bowel: Stomach is grossly unremarkable. Mild wall  thickening with submucosal enhancement and adjacent inflammatory stranding along the first and second portion of the duodenum. No small bowel dilation. The appendix is not definitely visualized however there is no pericecal inflammation. The colon is predominately decompressed without suspicious wall thickening or mass like lesions. Vascular/Lymphatic: No significant vascular findings are present. No enlarged abdominal or pelvic lymph nodes. Reproductive: Uterus and bilateral adnexa are unremarkable. Other: No walled off fluid collections.  No pneumoperitoneum. Musculoskeletal: L5-S1 discogenic disease. No acute osseous abnormality. IMPRESSION: 1. Acute interstitial pancreatitis. No walled off fluid collections, evidence of pancreatic necrosis or pancreatic ductal dilation. 2. Distended gallbladder with wall thickening and pericholecystic fluid, suspicious for acute cholecystitis in the appropriate clinical setting. 3. Common bile duct measures upper limits of normal at 6 mm no intrahepatic biliary ductal dilation. No radiopaque choledocholithiasis visualized. 4. Mild wall thickening with submucosal enhancement and adjacent inflammatory stranding along the first and second portion of the duodenum, likely reactive. 5. Diffuse hepatic steatosis. Electronically Signed   By: JDahlia BailiffMD   On: 08/16/2020 22:49   UKoreaAbdomen Limited RUQ (LIVER/GB)  Result Date: 08/16/2020 CLINICAL DATA:  Gallbladder colic.  Elevated LFTs. EXAM: ULTRASOUND ABDOMEN LIMITED RIGHT UPPER QUADRANT COMPARISON:  None. FINDINGS: Gallbladder: Distended containing intraluminal sludge and stones, as well as non mobile gallstones in the gallbladder neck. Borderline wall thickness of 3 mm. Trace pericholecystic fluid. No sonographic Murphy sign noted by sonographer. Common bile duct: Diameter: 8-10 mm, no visualized choledocholithiasis. Liver: Heterogeneous and mildly increased compared to right kidney. Probable geographic areas of steatosis.  No discrete focal lesion. Portal vein is patent on color Doppler imaging with normal direction of blood flow towards the liver. Other: None. IMPRESSION: 1. Distended gallbladder containing sludge and stones, as well as a non mobile stone in the gallbladder neck. Borderline wall thickness of 3 mm with trace pericholecystic fluid. Findings may represent acute cholecystitis in the appropriate clinical setting. 2. Dilated common bile duct at 8-10 mm, no visualized choledocholithiasis. 3. Heterogeneous increased hepatic parenchymal echogenicity most consistent with steatosis. Electronically Signed   By: MKeith RakeM.D.   On: 08/16/2020 21:00      IMPRESSION:   *   Acute pancreatitis.  Presumed biliary.  Borderline dilation of CBD but no evidence of CBD stone.  May have passed stone. No evidence for fluid collection, necrosis.  *    Acute cholecystitis.  General surgery  planning lap chole once pancreatitis improves.  *   Fatty liver.    PLAN:     *   MRCP, GERD.  *   Discontinued the Zosyn.  Agree with surgery that antibiotics are not indicated.  Azucena Freed  08/17/2020, 8:35 AM Phone [phone]  GI ATTENDING  History, laboratories, x-rays reviewed.  Case discussed with GI physician assistant.  Agree with comprehensive consultation note as outlined above.  Patient presents with acute biliary pancreatitis with probable concurrent cholecystitis.  Elevated liver tests noted.  No common duct stone on imaging.  Question if the patient may have passed a stone.  Next step would be MRCP to rule out ongoing choledocholithiasis and allow for best management strategy moving forward.  Continue with maximal supportive care.  We will follow-up after MRCP results available.  Thanks  Docia Chuck. Geri Seminole., M.D. [facility] Division of Gastroenterology

## 2020-08-17 NOTE — Progress Notes (Addendum)
Patient ID: Kathleen Carney, female   DOB: March 08, 1976, 45 y.o.   MRN: [id number]  PROGRESS NOTE    Kathleen Carney  WUJ:[id number] DOB: 25-Jul-1975 DOA: 08/16/2020 PCP: Pcp, No   Brief Narrative:  45 year old female with no past medical history presented with nausea, vomiting and abdominal pain and was found to have acute gallstone pancreatitis with lipase of 4000 with leukocytosis,?  Acute cholecystitis along with dilated CBD.  General surgery was consulted.  Patient was started on IV antibiotics and fluids.  Assessment & Plan:   Acute gallstone pancreatitis ?Acute cholecystitis Leukocytosis Elevated LFTs -CT of the abdomen and pelvis showed acute pancreatitis with no pancreatic necrosis or pancreatic ductal dilatation along with possible acute cholecystitis.  Right upper quadrant ultrasound showed dilated common bile duct at 8 to 10 mm but no visualized choledocholithiasis.  General surgery following and recommend GI evaluation.  I have asked GI for consultation. -Continue IV fluids/pain management/as needed antiemetics -General surgery thinks that patient does not have acute cholecystitis and recommended to stop Zosyn. Will stop Zosyn    DVT prophylaxis: SCDs Code Status: Full Family Communication: None at bedside Disposition Plan: Status is: Inpatient  Remains inpatient appropriate because:Inpatient level of care appropriate due to severity of illness   Dispo: The patient is from: Home              Anticipated d/c is to: Home              Patient currently is not medically stable to d/c.   Difficult to place patient No    Consultants: General surgery/GI  Procedures: None  Antimicrobials: Zosyn from 08/03/2020 onwards   Subjective: Patient seen and examined at bedside.  Complains of upper abdominal pain which is currently controlled with pain medications.  Intermittently nauseous.  No overnight fever or vomiting reported.  Objective: Vitals:   08/17/20 0230 08/17/20 0400  08/17/20 0600 08/17/20 0724  BP: 127/72 128/81 120/84 129/86  Pulse: 73 85 95 (!) 102  Resp: 17 17 20 16   Temp:    98.7 F (37.1 C)  TempSrc:    Oral  SpO2: 98% 97% 98% 95%  Weight:    68.9 kg  Height:    5\' 3"  (1.6 m)    Intake/Output Summary (Last 24 hours) at 08/17/2020 0810 Last data filed at 08/16/2020 2317 Gross per 24 hour  Intake 2100 ml  Output --  Net 2100 ml   Filed Weights   08/17/20 0724  Weight: 68.9 kg    Examination:  General exam: Appears calm and comfortable.  Currently on room air Respiratory system: Bilateral decreased breath sounds at bases Cardiovascular system: S1 & S2 heard, intermittently tachycardic Gastrointestinal system: Abdomen is slightly distended, soft and tender in the right upper quadrant. Normal bowel sounds heard. Extremities: No cyanosis, clubbing, edema  Central nervous system: Alert and oriented. No focal neurological deficits. Moving extremities Skin: No rashes, lesions or ulcers Psychiatry: Judgement and insight appear normal. Mood & affect appropriate.     Data Reviewed: I have personally reviewed following labs and imaging studies  CBC: Recent Labs  Lab 08/16/20 1801 08/17/20 0645  WBC 14.7* 13.4*  NEUTROABS 13.1*  --   HGB 14.2 12.8  HCT 41.3 38.0  MCV 88.2 89.8  PLT 298 207   Basic Metabolic Panel: Recent Labs  Lab 08/16/20 1801  NA 134*  K 3.5  CL 96*  CO2 22  GLUCOSE 155*  BUN 7  CREATININE 0.72  CALCIUM 9.2  GFR: Estimated Creatinine Clearance: 83.6 mL/min (by C-G formula based on SCr of 0.72 mg/dL). Liver Function Tests: Recent Labs  Lab 08/16/20 1801  AST 254*  ALT 451*  ALKPHOS 178*  BILITOT 6.8*  PROT 7.5  ALBUMIN 4.3   Recent Labs  Lab 08/16/20 1801  LIPASE 4,607*   No results for input(s): AMMONIA in the last 168 hours. Coagulation Profile: No results for input(s): INR, PROTIME in the last 168 hours. Cardiac Enzymes: No results for input(s): CKTOTAL, CKMB, CKMBINDEX, TROPONINI  in the last 168 hours. BNP (last 3 results) No results for input(s): PROBNP in the last 8760 hours. HbA1C: No results for input(s): HGBA1C in the last 72 hours. CBG: No results for input(s): GLUCAP in the last 168 hours. Lipid Profile: No results for input(s): CHOL, HDL, LDLCALC, TRIG, CHOLHDL, LDLDIRECT in the last 72 hours. Thyroid Function Tests: No results for input(s): TSH, T4TOTAL, FREET4, T3FREE, THYROIDAB in the last 72 hours. Anemia Panel: No results for input(s): VITAMINB12, FOLATE, FERRITIN, TIBC, IRON, RETICCTPCT in the last 72 hours. Sepsis Labs: No results for input(s): PROCALCITON, LATICACIDVEN in the last 168 hours.  No results found for this or any previous visit (from the past 240 hour(s)).       Radiology Studies: CT Abdomen Pelvis W Contrast  Result Date: 08/16/2020 CLINICAL DATA:  Abdominal pain since this a.m. EXAM: CT ABDOMEN AND PELVIS WITH CONTRAST TECHNIQUE: Multidetector CT imaging of the abdomen and pelvis was performed using the standard protocol following bolus administration of intravenous contrast. CONTRAST:  OMNIPAQUE IOHEXOL 300 MG/ML  SOLN COMPARISON:  Same day abdominal ultrasound FINDINGS: Lower chest: Dependent atelectasis. Normal size heart. No significant pericardial effusion/thickening. Hepatobiliary: Diffuse hepatic steatosis. No suspicious hepatic lesion. Gallbladder is distended with wall thickening and pericholecystic fluid. The common duct measures upper limits of normal at 6 mm. No radiopaque choledocholithiasis visualized. Pancreas: Edematous appearance of the pancreatic parenchyma with peripancreatic stranding. No pancreatic ductal dilation. No focal areas of pancreatic nonenhancement. No walled off collections. Spleen: Normal in size without focal abnormality. Adrenals/Urinary Tract: Adrenal glands are unremarkable. No hydronephrosis. Tiny hypodense bilateral renal lesions which are technically too small to accurately characterize but  favored represent cysts. No solid enhancing renal lesion. Bladder is unremarkable for degree of distension. Stomach/Bowel: Stomach is grossly unremarkable. Mild wall thickening with submucosal enhancement and adjacent inflammatory stranding along the first and second portion of the duodenum. No small bowel dilation. The appendix is not definitely visualized however there is no pericecal inflammation. The colon is predominately decompressed without suspicious wall thickening or mass like lesions. Vascular/Lymphatic: No significant vascular findings are present. No enlarged abdominal or pelvic lymph nodes. Reproductive: Uterus and bilateral adnexa are unremarkable. Other: No walled off fluid collections.  No pneumoperitoneum. Musculoskeletal: L5-S1 discogenic disease. No acute osseous abnormality. IMPRESSION: 1. Acute interstitial pancreatitis. No walled off fluid collections, evidence of pancreatic necrosis or pancreatic ductal dilation. 2. Distended gallbladder with wall thickening and pericholecystic fluid, suspicious for acute cholecystitis in the appropriate clinical setting. 3. Common bile duct measures upper limits of normal at 6 mm no intrahepatic biliary ductal dilation. No radiopaque choledocholithiasis visualized. 4. Mild wall thickening with submucosal enhancement and adjacent inflammatory stranding along the first and second portion of the duodenum, likely reactive. 5. Diffuse hepatic steatosis. Electronically Signed   By: Maudry Mayhew MD   On: 08/16/2020 22:49   US Abdomen Limited RUQ (LIVER/GB)  Result Date: 08/16/2020 CLINICAL DATA:  Gallbladder colic.  Elevated LFTs. EXAM: ULTRASOUND ABDOMEN  LIMITED RIGHT UPPER QUADRANT COMPARISON:  None. FINDINGS: Gallbladder: Distended containing intraluminal sludge and stones, as well as non mobile gallstones in the gallbladder neck. Borderline wall thickness of 3 mm. Trace pericholecystic fluid. No sonographic Murphy sign noted by sonographer. Common bile  duct: Diameter: 8-10 mm, no visualized choledocholithiasis. Liver: Heterogeneous and mildly increased compared to right kidney. Probable geographic areas of steatosis. No discrete focal lesion. Portal vein is patent on color Doppler imaging with normal direction of blood flow towards the liver. Other: None. IMPRESSION: 1. Distended gallbladder containing sludge and stones, as well as a non mobile stone in the gallbladder neck. Borderline wall thickness of 3 mm with trace pericholecystic fluid. Findings may represent acute cholecystitis in the appropriate clinical setting. 2. Dilated common bile duct at 8-10 mm, no visualized choledocholithiasis. 3. Heterogeneous increased hepatic parenchymal echogenicity most consistent with steatosis. Electronically Signed   By: Narda Rutherford M.D.   On: 08/16/2020 21:00        Scheduled Meds: Continuous Infusions: . lactated ringers    . piperacillin-tazobactam            Glade Lloyd, MD [facility] 08/17/2020, 8:10 AM

## 2020-08-17 NOTE — Progress Notes (Signed)
Progress Note     Subjective: Patient just got dilaudid and reports she is feeling better at the moment. She also received antiemetic and reports nausea has improved.   Objective: Vital signs in last 24 hours: Temp:  [97.6 F (36.4 C)-98.7 F (37.1 C)] 98.7 F (37.1 C) (04/26 0724) Pulse Rate:  [55-102] 102 (04/26 0724) Resp:  [10-22] 16 (04/26 0724) BP: (117-154)/(71-104) 129/86 (04/26 0724) SpO2:  [95 %-99 %] 95 % (04/26 0724) Weight:  [68.9 kg] 68.9 kg (04/26 0724)    Intake/Output from previous day: 04/25 0701 - 04/26 0700 In: 2100 [IV Piggyback:2100] Out: -  Intake/Output this shift: No intake/output data recorded.  PE: General: pleasant, WD, WN female who is laying in bed and lethargic but easily awakens HEENT: Sclera are anicteric.   Heart: regular, rate, and rhythm.   Lungs: CTAB, no wheezes, rhonchi, or rales noted.  Respiratory effort nonlabored Abd: soft, ttp in epigastrium without peritonitis, ND but some fullness in epigastric abdomen, +BS, no masses, hernias, or organomegaly     Lab Results:  Recent Labs    08/16/20 1801 08/17/20 0645  WBC 14.7* 13.4*  HGB 14.2 12.8  HCT 41.3 38.0  PLT 298 207   BMET Recent Labs    08/16/20 1801  NA 134*  K 3.5  CL 96*  CO2 22  GLUCOSE 155*  BUN 7  CREATININE 0.72  CALCIUM 9.2   PT/INR No results for input(s): LABPROT, INR in the last 72 hours. CMP     Component Value Date/Time   NA 134 (L) 08/16/2020 1801   K 3.5 08/16/2020 1801   CL 96 (L) 08/16/2020 1801   CO2 22 08/16/2020 1801   GLUCOSE 155 (H) 08/16/2020 1801   BUN 7 08/16/2020 1801   CREATININE 0.72 08/16/2020 1801   CALCIUM 9.2 08/16/2020 1801   PROT 7.5 08/16/2020 1801   ALBUMIN 4.3 08/16/2020 1801   AST 254 (H) 08/16/2020 1801   ALT 451 (H) 08/16/2020 1801   ALKPHOS 178 (H) 08/16/2020 1801   BILITOT 6.8 (H) 08/16/2020 1801   GFRNONAA >60 08/16/2020 1801   Lipase     Component Value Date/Time   LIPASE 4,607 (H) 08/16/2020  1801       Studies/Results: CT Abdomen Pelvis W Contrast  Result Date: 08/16/2020 CLINICAL DATA:  Abdominal pain since this a.m. EXAM: CT ABDOMEN AND PELVIS WITH CONTRAST TECHNIQUE: Multidetector CT imaging of the abdomen and pelvis was performed using the standard protocol following bolus administration of intravenous contrast. CONTRAST:  OMNIPAQUE IOHEXOL 300 MG/ML  SOLN COMPARISON:  Same day abdominal ultrasound FINDINGS: Lower chest: Dependent atelectasis. Normal size heart. No significant pericardial effusion/thickening. Hepatobiliary: Diffuse hepatic steatosis. No suspicious hepatic lesion. Gallbladder is distended with wall thickening and pericholecystic fluid. The common duct measures upper limits of normal at 6 mm. No radiopaque choledocholithiasis visualized. Pancreas: Edematous appearance of the pancreatic parenchyma with peripancreatic stranding. No pancreatic ductal dilation. No focal areas of pancreatic nonenhancement. No walled off collections. Spleen: Normal in size without focal abnormality. Adrenals/Urinary Tract: Adrenal glands are unremarkable. No hydronephrosis. Tiny hypodense bilateral renal lesions which are technically too small to accurately characterize but favored represent cysts. No solid enhancing renal lesion. Bladder is unremarkable for degree of distension. Stomach/Bowel: Stomach is grossly unremarkable. Mild wall thickening with submucosal enhancement and adjacent inflammatory stranding along the first and second portion of the duodenum. No small bowel dilation. The appendix is not definitely visualized however there is no pericecal inflammation. The  colon is predominately decompressed without suspicious wall thickening or mass like lesions. Vascular/Lymphatic: No significant vascular findings are present. No enlarged abdominal or pelvic lymph nodes. Reproductive: Uterus and bilateral adnexa are unremarkable. Other: No walled off fluid collections.  No  pneumoperitoneum. Musculoskeletal: L5-S1 discogenic disease. No acute osseous abnormality. IMPRESSION: 1. Acute interstitial pancreatitis. No walled off fluid collections, evidence of pancreatic necrosis or pancreatic ductal dilation. 2. Distended gallbladder with wall thickening and pericholecystic fluid, suspicious for acute cholecystitis in the appropriate clinical setting. 3. Common bile duct measures upper limits of normal at 6 mm no intrahepatic biliary ductal dilation. No radiopaque choledocholithiasis visualized. 4. Mild wall thickening with submucosal enhancement and adjacent inflammatory stranding along the first and second portion of the duodenum, likely reactive. 5. Diffuse hepatic steatosis. Electronically Signed   By: Maudry Mayhew MD   On: 08/16/2020 22:49   US Abdomen Limited RUQ (LIVER/GB)  Result Date: 08/16/2020 CLINICAL DATA:  Gallbladder colic.  Elevated LFTs. EXAM: ULTRASOUND ABDOMEN LIMITED RIGHT UPPER QUADRANT COMPARISON:  None. FINDINGS: Gallbladder: Distended containing intraluminal sludge and stones, as well as non mobile gallstones in the gallbladder neck. Borderline wall thickness of 3 mm. Trace pericholecystic fluid. No sonographic Murphy sign noted by sonographer. Common bile duct: Diameter: 8-10 mm, no visualized choledocholithiasis. Liver: Heterogeneous and mildly increased compared to right kidney. Probable geographic areas of steatosis. No discrete focal lesion. Portal vein is patent on color Doppler imaging with normal direction of blood flow towards the liver. Other: None. IMPRESSION: 1. Distended gallbladder containing sludge and stones, as well as a non mobile stone in the gallbladder neck. Borderline wall thickness of 3 mm with trace pericholecystic fluid. Findings may represent acute cholecystitis in the appropriate clinical setting. 2. Dilated common bile duct at 8-10 mm, no visualized choledocholithiasis. 3. Heterogeneous increased hepatic parenchymal echogenicity most  consistent with steatosis. Electronically Signed   By: Narda Rutherford M.D.   On: 08/16/2020 21:00    Anti-infectives: Anti-infectives (From admission, onward)   Start     Dose/Rate Route Frequency Ordered Stop   08/17/20 0730  piperacillin-tazobactam (ZOSYN) IVPB 3.375 g        3.375 g 12.5 mL/hr over 240 Minutes Intravenous Every 8 hours 08/17/20 0728     08/17/20 0645  piperacillin-tazobactam (ZOSYN) IVPB 3.375 g  Status:  Discontinued        3.375 g 100 mL/hr over 30 Minutes Intravenous Every 8 hours 08/17/20 0644 08/17/20 0728   08/16/20 2115  piperacillin-tazobactam (ZOSYN) IVPB 3.375 g        3.375 g 100 mL/hr over 30 Minutes Intravenous  Once 08/16/20 2113 08/16/20 2200       Assessment/Plan Biliary pancreatitis  - lipase 4600 on admit, Tbili 6.8 - repeat LFTs and lipase this AM - RUQ [place] with gallstones, CT with interstitial pancreatitis without fluid collections or necrosis - recommend GI consult  - patient clinically does not have cholecystitis - pericholecystic fluid and wall thickening are likely reactive from pancreatitis; no need for further abx from a surgical standpoint  - will continue to follow but patient is not ready for cholecystectomy at this time  FEN: NPO, IVF VTE: SCDs, ok to have chemical DVT prophylaxis from a surgical standpoint  ID: Zosyn 4/25>> (ok to stop abx from a surgical standpoint)  LOS: 1 day    Juliet Rude, [facility] 08/17/2020, 8:08 AM Please see Amion for pager number during day hours 7:00am-4:30pm

## 2020-08-17 NOTE — ED Notes (Signed)
Attempted report X1

## 2020-08-17 NOTE — Progress Notes (Signed)
NEW ADMISSION NOTE  Arrival Method: bed Mental Orientation: Alert and oriented Telemetry: No Assessment: Completed Skin: see notes, intact Iv: right upper arm Pain: 0 Tubes: 0 Safety Measures: Safety Fall Prevention Plan has been given, discussed and signed Admission: Completed [facility] Orientation: Patient has been orientated to the room, unit and staff.  Family: 0  Orders have been reviewed and implemented. Will continue to monitor the patient. Call light has been placed within reach and bed alarm has been activated.   Patient reported that they received their Moderna COVID vaccination around 06/2019 but that they do not have their vaccination card with them.  Pat Patrick, RN

## 2020-08-18 ENCOUNTER — Inpatient Hospital Stay (HOSPITAL_COMMUNITY): Payer: BC Managed Care – PPO

## 2020-08-18 DIAGNOSIS — K810 Acute cholecystitis: Secondary | ICD-10-CM | POA: Diagnosis not present

## 2020-08-18 DIAGNOSIS — K851 Biliary acute pancreatitis without necrosis or infection: Secondary | ICD-10-CM | POA: Diagnosis not present

## 2020-08-18 DIAGNOSIS — D72828 Other elevated white blood cell count: Secondary | ICD-10-CM

## 2020-08-18 LAB — COMPREHENSIVE METABOLIC PANEL
ALT: 273 U/L — ABNORMAL HIGH (ref 0–44)
AST: 112 U/L — ABNORMAL HIGH (ref 15–41)
Albumin: 3.1 g/dL — ABNORMAL LOW (ref 3.5–5.0)
Alkaline Phosphatase: 121 U/L (ref 38–126)
Anion gap: 9 (ref 5–15)
BUN: <5 mg/dL — ABNORMAL LOW (ref 6–20)
CO2: 25 mmol/L (ref 22–32)
Calcium: 8.3 mg/dL — ABNORMAL LOW (ref 8.9–10.3)
Chloride: 100 mmol/L (ref 98–111)
Creatinine, Ser: 0.59 mg/dL (ref 0.44–1.00)
GFR, Estimated: >60 mL/min (ref >60)
Glucose, Bld: 106 mg/dL — ABNORMAL HIGH (ref 70–99)
Potassium: 3.3 mmol/L — ABNORMAL LOW (ref 3.5–5.1)
Sodium: 134 mmol/L — ABNORMAL LOW (ref 135–145)
Total Bilirubin: 2.6 mg/dL — ABNORMAL HIGH (ref 0.3–1.2)
Total Protein: 6.1 g/dL — ABNORMAL LOW (ref 6.5–8.1)

## 2020-08-18 LAB — URINALYSIS, COMPLETE (UACMP) WITH MICROSCOPIC
Bacteria, UA: NONE SEEN
Bilirubin Urine: NEGATIVE
Glucose, UA: NEGATIVE mg/dL
Ketones, ur: 80 mg/dL — AB
Leukocytes,Ua: NEGATIVE
Nitrite: NEGATIVE
Protein, ur: NEGATIVE mg/dL
Specific Gravity, Urine: 1.02 (ref 1.005–1.030)
pH: 6 (ref 5.0–8.0)

## 2020-08-18 LAB — CBC WITH DIFFERENTIAL/PLATELET
Abs Immature Granulocytes: 0.07 10*3/uL (ref 0.00–0.07)
Basophils Absolute: 0 10*3/uL (ref 0.0–0.1)
Basophils Relative: 0 %
Eosinophils Absolute: 0 10*3/uL (ref 0.0–0.5)
Eosinophils Relative: 0 %
HCT: 34.9 % — ABNORMAL LOW (ref 36.0–46.0)
Hemoglobin: 11.6 g/dL — ABNORMAL LOW (ref 12.0–15.0)
Immature Granulocytes: 1 %
Lymphocytes Relative: 10 %
Lymphs Abs: 1.5 10*3/uL (ref 0.7–4.0)
MCH: 30.4 pg (ref 26.0–34.0)
MCHC: 33.2 g/dL (ref 30.0–36.0)
MCV: 91.4 fL (ref 80.0–100.0)
Monocytes Absolute: 1.1 10*3/uL — ABNORMAL HIGH (ref 0.1–1.0)
Monocytes Relative: 7 %
Neutro Abs: 12.6 10*3/uL — ABNORMAL HIGH (ref 1.7–7.7)
Neutrophils Relative %: 82 %
Platelets: 173 10*3/uL (ref 150–400)
RBC: 3.82 MIL/uL — ABNORMAL LOW (ref 3.87–5.11)
RDW: 12.7 % (ref 11.5–15.5)
WBC: 15.4 10*3/uL — ABNORMAL HIGH (ref 4.0–10.5)
nRBC: 0 % (ref 0.0–0.2)

## 2020-08-18 LAB — MAGNESIUM: Magnesium: 1.7 mg/dL (ref 1.7–2.4)

## 2020-08-18 MED ORDER — ACETAMINOPHEN 325 MG PO TABS
650.0000 mg | ORAL_TABLET | ORAL | Status: DC | PRN
  Administered 2020-08-18: 650 mg via ORAL
  Filled 2020-08-18: qty 2

## 2020-08-18 MED ORDER — ENOXAPARIN SODIUM 40 MG/0.4ML ~~LOC~~ SOLN
40.0000 mg | SUBCUTANEOUS | Status: DC
  Administered 2020-08-18 – 2020-08-20 (×2): 40 mg via SUBCUTANEOUS
  Filled 2020-08-18 (×3): qty 0.4

## 2020-08-18 NOTE — Progress Notes (Addendum)
Daily Rounding Note  08/18/2020, 12:57 PM  LOS: 2 days   SUBJECTIVE:   Chief complaint: Biliary pancreatitis.     Still having pain at the epigastrium, severe at times.  At one point it was triggered by Jell-O but this morning she is tolerating clear liquids.  No nausea or vomiting. Sweating last night w temp to 101.4 around midnight.  OBJECTIVE:         Vital signs in last 24 hours:    Temp:  [98 F (36.7 C)-101.4 F (38.6 C)] 98.2 F (36.8 C) (04/27 0945) Pulse Rate:  [84-118] 85 (04/27 0945) Resp:  [17-20] 18 (04/27 0945) BP: (103-117)/(68-79) 117/79 (04/27 0945) SpO2:  [94 %-98 %] 97 % (04/27 0945) Last BM Date: 08/16/20 Filed Weights   08/17/20 0724 08/17/20 0800  Weight: 68.9 kg 70.5 kg   General: Patient looks well.  Resting comfortably in bed watching television. Heart: RRR. Chest: No labored breathing or cough Abdomen: Soft, focal tenderness without guarding or rebound in the epigastrium.  Not distended.  Bowel sounds active. Extremities: No CCE. Neuro/Psych: Calm, pleasant, cooperative.  Intake/Output from previous day: 04/26 0701 - 04/27 0700 In: 2816.2 [P.O.:420; I.V.:2396.2] Out: 400 [Urine:400]  Intake/Output this shift: Total I/O In: 700 [P.O.:700] Out: -   Lab Results: Recent Labs    08/16/20 1801 08/17/20 0645 08/18/20 0340  WBC 14.7* 13.4* 15.4*  HGB 14.2 12.8 11.6*  HCT 41.3 38.0 34.9*  PLT 298 207 173   BMET Recent Labs    08/16/20 1801 08/17/20 0835 08/18/20 0207  NA 134* 134* 134*  K 3.5 3.3* 3.3*  CL 96* 100 100  CO2 22 25 25   GLUCOSE 155* 110* 106*  BUN 7 5* <5*  CREATININE 0.72 0.67 0.59  CALCIUM 9.2 8.6* 8.3*   LFT Recent Labs    08/16/20 1801 08/17/20 0835 08/18/20 0207  PROT 7.5 6.4* 6.1*  ALBUMIN 4.3 3.5 3.1*  AST 254* 169* 112*  ALT 451* 337* 273*  ALKPHOS 178* 147* 121  BILITOT 6.8* 3.0* 2.6*   PT/INR No results for input(s): LABPROT, INR in  the last 72 hours. Hepatitis Panel No results for input(s): HEPBSAG, HCVAB, HEPAIGM, HEPBIGM in the last 72 hours.  Studies/Results: DG Chest 1 View  Result Date: 08/18/2020 CLINICAL DATA:  Pneumonia EXAM: CHEST  1 VIEW COMPARISON:  None. FINDINGS: The heart size and mediastinal contours are within normal limits. Both lungs are clear. The visualized skeletal structures are unremarkable. IMPRESSION: No active disease. Electronically Signed   By: 08/20/2020 MD   On: 08/18/2020 01:39   CT Abdomen Pelvis W Contrast  Result Date: 08/16/2020 CLINICAL DATA:  Abdominal pain since this a.m. EXAM: CT ABDOMEN AND PELVIS WITH CONTRAST TECHNIQUE: Multidetector CT imaging of the abdomen and pelvis was performed using the standard protocol following bolus administration of intravenous contrast. CONTRAST:  08/18/2020 OMNIPAQUE IOHEXOL 300 MG/ML  SOLN COMPARISON:  Same day abdominal ultrasound FINDINGS: Lower chest: Dependent atelectasis. Normal size heart. No significant pericardial effusion/thickening. Hepatobiliary: Diffuse hepatic steatosis. No suspicious hepatic lesion. Gallbladder is distended with wall thickening and pericholecystic fluid. The common duct measures upper limits of normal at 6 mm. No radiopaque choledocholithiasis visualized. Pancreas: Edematous appearance of the pancreatic parenchyma with peripancreatic stranding. No pancreatic ductal dilation. No focal areas of pancreatic nonenhancement. No walled off collections. Spleen: Normal in size without focal abnormality. Adrenals/Urinary Tract: Adrenal glands are unremarkable. No hydronephrosis. Tiny hypodense bilateral renal lesions which  are technically too small to accurately characterize but favored represent cysts. No solid enhancing renal lesion. Bladder is unremarkable for degree of distension. Stomach/Bowel: Stomach is grossly unremarkable. Mild wall thickening with submucosal enhancement and adjacent inflammatory stranding along the first and  second portion of the duodenum. No small bowel dilation. The appendix is not definitely visualized however there is no pericecal inflammation. The colon is predominately decompressed without suspicious wall thickening or mass like lesions. Vascular/Lymphatic: No significant vascular findings are present. No enlarged abdominal or pelvic lymph nodes. Reproductive: Uterus and bilateral adnexa are unremarkable. Other: No walled off fluid collections.  No pneumoperitoneum. Musculoskeletal: L5-S1 discogenic disease. No acute osseous abnormality. IMPRESSION: 1. Acute interstitial pancreatitis. No walled off fluid collections, evidence of pancreatic necrosis or pancreatic ductal dilation. 2. Distended gallbladder with wall thickening and pericholecystic fluid, suspicious for acute cholecystitis in the appropriate clinical setting. 3. Common bile duct measures upper limits of normal at 6 mm no intrahepatic biliary ductal dilation. No radiopaque choledocholithiasis visualized. 4. Mild wall thickening with submucosal enhancement and adjacent inflammatory stranding along the first and second portion of the duodenum, likely reactive. 5. Diffuse hepatic steatosis. Electronically Signed   By: Maudry Mayhew MD   On: 08/16/2020 22:49   MR ABDOMEN MRCP W WO CONTAST  Result Date: 08/17/2020 CLINICAL DATA:  Abdominal pain.  Acute pancreatitis. EXAM: MRI ABDOMEN WITHOUT AND WITH CONTRAST (INCLUDING MRCP) TECHNIQUE: Multiplanar multisequence MR imaging of the abdomen was performed both before and after the administration of intravenous contrast. Heavily T2-weighted images of the biliary and pancreatic ducts were obtained, and three-dimensional MRCP images were rendered by post processing. CONTRAST:  44mL GADAVIST GADOBUTROL 1 MMOL/ML IV SOLN COMPARISON:  CT on 08/16/2020 FINDINGS: Lower chest: Dependent bibasilar atelectasis noted. Hepatobiliary: Tiny sub-cm cyst seen in posterior right lobe. No hepatic masses identified. A few tiny  gallstones are seen. Gallbladder is distended and shows mild diffuse gallbladder wall thickening. No evidence of biliary ductal dilatation or choledocholithiasis. Pancreas: Diffuse pancreatic edema and moderate peripancreatic inflammatory changes are seen, consistent with acute pancreatitis. No evidence of pancreatic necrosis. No evidence of pancreatic mass or pancreatic ductal dilatation. No peripancreatic fluid collections identified. Spleen:  Within normal limits in size and appearance. Adrenals/Urinary Tract: No masses identified. Tiny bilateral renal cysts noted. No evidence of hydronephrosis. Stomach/Bowel: Mild diffuse small bowel dilatation is seen, likely due to reactive ileus. Vascular/Lymphatic: No pathologically enlarged lymph nodes identified. No abdominal aortic aneurysm. Other:  None. Musculoskeletal:  No suspicious bone lesions identified. IMPRESSION: Moderate acute pancreatitis. No evidence of pancreatic necrosis, pseudocyst, or other complication. Cholelithiasis. Distended gallbladder with mild diffuse wall thickening, which could be secondary to acute pancreatitis although acute cholecystitis cannot be excluded. Consider nuclear medicine HIDA scan for further evaluation if clinically warranted. No evidence of biliary ductal dilatation or choledocholithiasis. Mild diffuse small bowel dilatation, likely due to reactive ileus. Electronically Signed   By: Danae Orleans M.D.   On: 08/17/2020 19:31   US Abdomen Limited RUQ (LIVER/GB)  Result Date: 08/16/2020 CLINICAL DATA:  Gallbladder colic.  Elevated LFTs. EXAM: ULTRASOUND ABDOMEN LIMITED RIGHT UPPER QUADRANT COMPARISON:  None. FINDINGS: Gallbladder: Distended containing intraluminal sludge and stones, as well as non mobile gallstones in the gallbladder neck. Borderline wall thickness of 3 mm. Trace pericholecystic fluid. No sonographic Murphy sign noted by sonographer. Common bile duct: Diameter: 8-10 mm, no visualized choledocholithiasis. Liver:  Heterogeneous and mildly increased compared to right kidney. Probable geographic areas of steatosis. No discrete focal lesion. Portal vein  is patent on color Doppler imaging with normal direction of blood flow towards the liver. Other: None. IMPRESSION: 1. Distended gallbladder containing sludge and stones, as well as a non mobile stone in the gallbladder neck. Borderline wall thickness of 3 mm with trace pericholecystic fluid. Findings may represent acute cholecystitis in the appropriate clinical setting. 2. Dilated common bile duct at 8-10 mm, no visualized choledocholithiasis. 3. Heterogeneous increased hepatic parenchymal echogenicity most consistent with steatosis. Electronically Signed   By: Narda Rutherford M.D.   On: 08/16/2020 21:00    ASSESMENT:   *   Acute pancreatitis, presumed biliary secondary to now passed stone. No evidence of residual CBD dilation or presence of stones on MRCP. Clinically improved.  LFTs rapidly normalizing.  Lipase improved, not assayed today. Fever to 101.4 Fahrenheit overnight with WBCs this morning 15.4. The ultrasound raised concern for acute cholecystitis.  Per surgical note today they did not feel she had acute cholecystitis but that the pericholecystic fluid and wall thickening were reactive to the pancreatitis.  Surgery does not feel she needs antibiotics as of this morning's rounds.    PLAN   *   Surgery planning lap chole Friday or Saturday.  *    consider adding antibiotics for the fever, leukocytosis.    *    GI signing off, available if needed.  No role for ERCP.    Jennye Moccasin  08/18/2020, 12:57 PM Phone [phone]  GI ATTENDING  Interval history data reviewed.  Patient seen and examined.  Agree with interval progress note as outlined above.  Patient with acute biliary pancreatitis.  Liver tests improving.  MRCP does not demonstrate biliary obstruction or choledocholithiasis.  Question of cholecystitis raised (though may be secondary to  peripancreatic inflammation.  You certainly can have leukocytosis and fever with uninfected pancreatitis.  If there is any question about cholecystitis, consider antibiotics.  Will defer to surgery.  When the patient is clinically appropriate, plans for laparoscopic cholecystectomy per general surgery.  GI available if needed.  We will sign off.  Thanks.  Wilhemina Bonito. Eda Keys., M.D. [facility] Division of Gastroenterology

## 2020-08-18 NOTE — Progress Notes (Signed)
HOSPITAL MEDICINE OVERNIGHT EVENT NOTE    Notified by nursing that patient is exhibiting a yellow MEWS score.  Patient is concurrently exhibiting a fever of 101.4 F.  Patient is currently being managed for acute pancreatitis.  Patient is currently not being provided with antibiotics.  Patient is complaining of pain thought to be secondary to pancreatitis.  Patient denies cough or dysuria.  Chart reviewed, while there was a question as to whether the patient is suffering from concurrent acute cholecystitis surgery does not believe that this is the case and therefore has recommended no antibiotics for this indication.  We will therefore evaluate the patient for other potential concurrent source of infection.  Obtaining blood cultures, urinalysis, urine culture, chest x-ray.  We will consider initiating antibiotics if appropriate based on this work-up.  Kathleen Elk  MD [facility]

## 2020-08-18 NOTE — Progress Notes (Signed)
Patient ID: Kathleen Carney, female   DOB: [birth year], 45 y.o.   MRN: [id number]  PROGRESS NOTE    Kathleen Carney  NWG:[id number] DOB: [birth year] DOA: 08/16/2020 PCP: Pcp, No   Brief Narrative:  45 year old female with no past medical history presented with nausea, vomiting and abdominal pain and was found to have acute gallstone pancreatitis with lipase of 4000 with leukocytosis,?  Acute cholecystitis along with dilated CBD.  General surgery was consulted.  Patient was started on IV antibiotics and fluids.  Assessment & Plan:   Acute gallstone pancreatitis Cholelithiasis -CT of the abdomen and pelvis notable for acute pancreatitis  -Right upper quadrant ultrasound showed dilated common bile duct at 8 to 10 mm but no visualized choledocholithiasis.  -MRCP was negative for choledocholithiasis -General surgery following, plan for lap chole when pancreatitis is subsiding -Continue IV fluids, clear liquids -Add Lovenox for DVT prophylaxis   DVT prophylaxis: Add Lovenox Code Status: Full Family Communication: None at bedside Disposition Plan: Status is: Inpatient  Remains inpatient appropriate because:Inpatient level of care appropriate due to severity of illness   Dispo: The patient is from: Home              Anticipated d/c is to: Home              Patient currently is not medically stable to d/c.   Difficult to place patient No    Consultants: General surgery/GI  Procedures: None  Antimicrobials: Zosyn from 08/03/2020 onwards   Subjective: -Had pain last night and low-grade fever  Objective: Vitals:   08/18/20 0006 08/18/20 0208 08/18/20 0408 08/18/20 0945  BP: 103/68 106/68 108/70 117/79  Pulse: (!) 118 94 92 85  Resp: 18 20 18 18   Temp: (!) 101.4 F (38.6 C) 99.6 F (37.6 C) 99.2 F (37.3 C) 98.2 F (36.8 C)  TempSrc: Oral Oral Oral Oral  SpO2: 94% 96% 97% 97%  Weight:      Height:        Intake/Output Summary (Last 24 hours) at 08/18/2020 1420 Last data filed at  08/18/2020 1227 Gross per 24 hour  Intake 3516.17 ml  Output 400 ml  Net 3116.17 ml   Filed Weights   08/17/20 0724 08/17/20 0800  Weight: 68.9 kg 70.5 kg    Examination:  General exam: Pleasant young female, laying in bed, AAOx3, no distress HEENT: No JVD, no icterus CVS: S1-S2, regular rate rhythm Lungs: Clear anteriorly Abdomen: Soft, mildly distended, minimal right-sided tenderness, bowel sounds present Extremities: No edema   Data Reviewed: I have personally reviewed following labs and imaging studies  CBC: Recent Labs  Lab 08/16/20 1801 08/17/20 0645 08/18/20 0340  WBC 14.7* 13.4* 15.4*  NEUTROABS 13.1*  --  12.6*  HGB 14.2 12.8 11.6*  HCT 41.3 38.0 34.9*  MCV 88.2 89.8 91.4  PLT 298 207 173   Basic Metabolic Panel: Recent Labs  Lab 08/16/20 1801 08/17/20 0835 08/18/20 0207  NA 134* 134* 134*  K 3.5 3.3* 3.3*  CL 96* 100 100  CO2 22 25 25   GLUCOSE 155* 110* 106*  BUN 7 5* <5*  CREATININE 0.72 0.67 0.59  CALCIUM 9.2 8.6* 8.3*  MG  --   --  1.7   GFR: Estimated Creatinine Clearance: 84.4 mL/min (by C-G formula based on SCr of 0.59 mg/dL). Liver Function Tests: Recent Labs  Lab 08/16/20 1801 08/17/20 0835 08/18/20 0207  AST 254* 169* 112*  ALT 451* 337* 273*  ALKPHOS 178* 147* 121  BILITOT 6.8*  3.0* 2.6*  PROT 7.5 6.4* 6.1*  ALBUMIN 4.3 3.5 3.1*   Recent Labs  Lab 08/16/20 1801 08/17/20 0835  LIPASE 4,607* 1,494*   No results for input(s): AMMONIA in the last 168 hours. Coagulation Profile: No results for input(s): INR, PROTIME in the last 168 hours. Cardiac Enzymes: No results for input(s): CKTOTAL, CKMB, CKMBINDEX, TROPONINI in the last 168 hours. BNP (last 3 results) No results for input(s): PROBNP in the last 8760 hours. HbA1C: No results for input(s): HGBA1C in the last 72 hours. CBG: No results for input(s): GLUCAP in the last 168 hours. Lipid Profile: No results for input(s): CHOL, HDL, LDLCALC, TRIG, CHOLHDL, LDLDIRECT in  the last 72 hours. Thyroid Function Tests: No results for input(s): TSH, T4TOTAL, FREET4, T3FREE, THYROIDAB in the last 72 hours. Anemia Panel: No results for input(s): VITAMINB12, FOLATE, FERRITIN, TIBC, IRON, RETICCTPCT in the last 72 hours. Sepsis Labs: No results for input(s): PROCALCITON, LATICACIDVEN in the last 168 hours.  Recent Results (from the past 240 hour(s))  SARS CORONAVIRUS 2 (TAT 6-24 HRS) Nasopharyngeal Nasopharyngeal Swab     Status: None   Collection Time: 08/17/20  1:15 AM   Specimen: Nasopharyngeal Swab  Result Value Ref Range Status   SARS Coronavirus 2 NEGATIVE NEGATIVE Final    Comment: (NOTE) SARS-CoV-2 target nucleic acids are NOT DETECTED.  The SARS-CoV-2 RNA is generally detectable in upper and lower respiratory specimens during the acute phase of infection. Negative results do not preclude SARS-CoV-2 infection, do not rule out co-infections with other pathogens, and should not be used as the sole basis for treatment or other patient management decisions. Negative results must be combined with clinical observations, patient history, and epidemiological information. The expected result is Negative.  Fact Sheet for Patients: HairSlick.no  Fact Sheet for Healthcare Providers: quierodirigir.com  This test is not yet approved or cleared by the [place] FDA and  has been authorized for detection and/or diagnosis of SARS-CoV-2 by FDA under an Emergency Use Authorization (EUA). This EUA will remain  in effect (meaning this test can be used) for the duration of the COVID-19 declaration under Se ction 564(b)(1) of the Act, 21 U.S.C. section 360bbb-3(b)(1), unless the authorization is terminated or revoked sooner.  Performed at [facility], [address], [place], [place] [address]          Radiology Studies: DG Chest 1 View  Result Date: 08/18/2020 CLINICAL DATA:  Pneumonia EXAM:  CHEST  1 VIEW COMPARISON:  None. FINDINGS: The heart size and mediastinal contours are within normal limits. Both lungs are clear. The visualized skeletal structures are unremarkable. IMPRESSION: No active disease. Electronically Signed   By: Helyn Numbers MD   On: 08/18/2020 01:39   CT Abdomen Pelvis W Contrast  Result Date: 08/16/2020 CLINICAL DATA:  Abdominal pain since this a.m. EXAM: CT ABDOMEN AND PELVIS WITH CONTRAST TECHNIQUE: Multidetector CT imaging of the abdomen and pelvis was performed using the standard protocol following bolus administration of intravenous contrast. CONTRAST:  OMNIPAQUE IOHEXOL 300 MG/ML  SOLN COMPARISON:  Same day abdominal ultrasound FINDINGS: Lower chest: Dependent atelectasis. Normal size heart. No significant pericardial effusion/thickening. Hepatobiliary: Diffuse hepatic steatosis. No suspicious hepatic lesion. Gallbladder is distended with wall thickening and pericholecystic fluid. The common duct measures upper limits of normal at 6 mm. No radiopaque choledocholithiasis visualized. Pancreas: Edematous appearance of the pancreatic parenchyma with peripancreatic stranding. No pancreatic ductal dilation. No focal areas of pancreatic nonenhancement. No walled off collections. Spleen: Normal  in size without focal abnormality. Adrenals/Urinary Tract: Adrenal glands are unremarkable. No hydronephrosis. Tiny hypodense bilateral renal lesions which are technically too small to accurately characterize but favored represent cysts. No solid enhancing renal lesion. Bladder is unremarkable for degree of distension. Stomach/Bowel: Stomach is grossly unremarkable. Mild wall thickening with submucosal enhancement and adjacent inflammatory stranding along the first and second portion of the duodenum. No small bowel dilation. The appendix is not definitely visualized however there is no pericecal inflammation. The colon is predominately decompressed without suspicious wall thickening  or mass like lesions. Vascular/Lymphatic: No significant vascular findings are present. No enlarged abdominal or pelvic lymph nodes. Reproductive: Uterus and bilateral adnexa are unremarkable. Other: No walled off fluid collections.  No pneumoperitoneum. Musculoskeletal: L5-S1 discogenic disease. No acute osseous abnormality. IMPRESSION: 1. Acute interstitial pancreatitis. No walled off fluid collections, evidence of pancreatic necrosis or pancreatic ductal dilation. 2. Distended gallbladder with wall thickening and pericholecystic fluid, suspicious for acute cholecystitis in the appropriate clinical setting. 3. Common bile duct measures upper limits of normal at 6 mm no intrahepatic biliary ductal dilation. No radiopaque choledocholithiasis visualized. 4. Mild wall thickening with submucosal enhancement and adjacent inflammatory stranding along the first and second portion of the duodenum, likely reactive. 5. Diffuse hepatic steatosis. Electronically Signed   By: Maudry Mayhew MD   On: 08/16/2020 22:49   MR ABDOMEN MRCP W WO CONTAST  Result Date: 08/17/2020 CLINICAL DATA:  Abdominal pain.  Acute pancreatitis. EXAM: MRI ABDOMEN WITHOUT AND WITH CONTRAST (INCLUDING MRCP) TECHNIQUE: Multiplanar multisequence MR imaging of the abdomen was performed both before and after the administration of intravenous contrast. Heavily T2-weighted images of the biliary and pancreatic ducts were obtained, and three-dimensional MRCP images were rendered by post processing. CONTRAST:  74mL GADAVIST GADOBUTROL 1 MMOL/ML IV SOLN COMPARISON:  CT on 08/16/2020 FINDINGS: Lower chest: Dependent bibasilar atelectasis noted. Hepatobiliary: Tiny sub-cm cyst seen in posterior right lobe. No hepatic masses identified. A few tiny gallstones are seen. Gallbladder is distended and shows mild diffuse gallbladder wall thickening. No evidence of biliary ductal dilatation or choledocholithiasis. Pancreas: Diffuse pancreatic edema and moderate  peripancreatic inflammatory changes are seen, consistent with acute pancreatitis. No evidence of pancreatic necrosis. No evidence of pancreatic mass or pancreatic ductal dilatation. No peripancreatic fluid collections identified. Spleen:  Within normal limits in size and appearance. Adrenals/Urinary Tract: No masses identified. Tiny bilateral renal cysts noted. No evidence of hydronephrosis. Stomach/Bowel: Mild diffuse small bowel dilatation is seen, likely due to reactive ileus. Vascular/Lymphatic: No pathologically enlarged lymph nodes identified. No abdominal aortic aneurysm. Other:  None. Musculoskeletal:  No suspicious bone lesions identified. IMPRESSION: Moderate acute pancreatitis. No evidence of pancreatic necrosis, pseudocyst, or other complication. Cholelithiasis. Distended gallbladder with mild diffuse wall thickening, which could be secondary to acute pancreatitis although acute cholecystitis cannot be excluded. Consider nuclear medicine HIDA scan for further evaluation if clinically warranted. No evidence of biliary ductal dilatation or choledocholithiasis. Mild diffuse small bowel dilatation, likely due to reactive ileus. Electronically Signed   By: Danae Orleans M.D.   On: 08/17/2020 19:31   US Abdomen Limited RUQ (LIVER/GB)  Result Date: 08/16/2020 CLINICAL DATA:  Gallbladder colic.  Elevated LFTs. EXAM: ULTRASOUND ABDOMEN LIMITED RIGHT UPPER QUADRANT COMPARISON:  None. FINDINGS: Gallbladder: Distended containing intraluminal sludge and stones, as well as non mobile gallstones in the gallbladder neck. Borderline wall thickness of 3 mm. Trace pericholecystic fluid. No sonographic Murphy sign noted by sonographer. Common bile duct: Diameter: 8-10 mm, no visualized choledocholithiasis. Liver: Heterogeneous  and mildly increased compared to right kidney. Probable geographic areas of steatosis. No discrete focal lesion. Portal vein is patent on color Doppler imaging with normal direction of blood flow  towards the liver. Other: None. IMPRESSION: 1. Distended gallbladder containing sludge and stones, as well as a non mobile stone in the gallbladder neck. Borderline wall thickness of 3 mm with trace pericholecystic fluid. Findings may represent acute cholecystitis in the appropriate clinical setting. 2. Dilated common bile duct at 8-10 mm, no visualized choledocholithiasis. 3. Heterogeneous increased hepatic parenchymal echogenicity most consistent with steatosis. Electronically Signed   By: Narda Rutherford M.D.   On: 08/16/2020 21:00    Scheduled Meds: Continuous Infusions: . lactated ringers 125 mL/hr at 08/18/20 1120    Zannie Cove, MD [facility] 08/18/2020, 2:20 PM

## 2020-08-18 NOTE — Plan of Care (Signed)

## 2020-08-18 NOTE — Progress Notes (Signed)
   08/18/20 0006  Assess: MEWS Score  Temp (!) 101.4 F (38.6 C)  BP 103/68  Pulse Rate (!) 118  ECG Heart Rate (!) 118  Resp 18  Level of Consciousness Alert  SpO2 94 %  O2 Device Room Air  Assess: MEWS Score  MEWS Temp 1  MEWS Systolic 0  MEWS Pulse 2  MEWS RR 0  MEWS LOC 0  MEWS Score 3  MEWS Score Color Yellow  Assess: if the MEWS score is Yellow or Red  Were vital signs taken at a resting state? Yes  Focused Assessment No change from prior assessment  Early Detection of Sepsis Score *See Row Information* Low  MEWS guidelines implemented *See Row Information* Yes  Treat  MEWS Interventions Escalated (See documentation below)  Take Vital Signs  Increase Vital Sign Frequency  Yellow: Q 2hr X 2 then Q 4hr X 2, if remains yellow, continue Q 4hrs  Escalate  MEWS: Escalate Yellow: discuss with charge nurse/RN and consider discussing with provider and RRT  Notify: Charge Nurse/RN  Name of Charge Nurse/RN Notified Janelle, RN  Date Charge Nurse/RN Notified 08/18/20  Time Charge Nurse/RN Notified 0010  Notify: Provider  Provider Name/Title Dr.George Shalhoub  Date Provider Notified 08/18/20  Time Provider Notified 0019  Notification Type Page  Notification Reason Other (Comment) (orders)  Provider response See new orders  Date of Provider Response 08/18/20  Time of Provider Response [phone]  Document  Patient Outcome Stabilized after interventions  Progress note created (see row info) Yes

## 2020-08-18 NOTE — Progress Notes (Signed)
Progress Note     Subjective: Patient still reports significant abdominal pain. Pain worsened by eating yesterday but she could tolerate water intake without much discomfort. Denies nausea or vomiting.   Objective: Vital signs in last 24 hours: Temp:  [98 F (36.7 C)-101.4 F (38.6 C)] 99.2 F (37.3 C) (04/27 0408) Pulse Rate:  [84-118] 92 (04/27 0408) Resp:  [17-20] 18 (04/27 0408) BP: (103-116)/(68-79) 108/70 (04/27 0408) SpO2:  [94 %-98 %] 97 % (04/27 0408) Last BM Date: 08/16/20  Intake/Output from previous day: 04/26 0701 - 04/27 0700 In: 2816.2 [P.O.:420; I.V.:2396.2] Out: 400 [Urine:400] Intake/Output this shift: Total I/O In: 300 [P.O.:300] Out: -   PE: General: pleasant, WD, WN female who is laying in bed and lethargic but easily awakens HEENT: Sclera are anicteric.   Heart: regular, rate, and rhythm.   Lungs: CTAB, no wheezes, rhonchi, or rales noted.  Respiratory effort nonlabored Abd: soft, ttp in epigastrium without peritonitis, ND but some fullness in epigastric abdomen, +BS, no masses, hernias, or organomegaly    Lab Results:  Recent Labs    08/17/20 0645 08/18/20 0340  WBC 13.4* 15.4*  HGB 12.8 11.6*  HCT 38.0 34.9*  PLT 207 173   BMET Recent Labs    08/17/20 0835 08/18/20 0207  NA 134* 134*  K 3.3* 3.3*  CL 100 100  CO2 25 25  GLUCOSE 110* 106*  BUN 5* <5*  CREATININE 0.67 0.59  CALCIUM 8.6* 8.3*   PT/INR No results for input(s): LABPROT, INR in the last 72 hours. CMP     Component Value Date/Time   NA 134 (L) 08/18/2020 0207   K 3.3 (L) 08/18/2020 0207   CL 100 08/18/2020 0207   CO2 25 08/18/2020 0207   GLUCOSE 106 (H) 08/18/2020 0207   BUN <5 (L) 08/18/2020 0207   CREATININE 0.59 08/18/2020 0207   CALCIUM 8.3 (L) 08/18/2020 0207   PROT 6.1 (L) 08/18/2020 0207   ALBUMIN 3.1 (L) 08/18/2020 0207   AST 112 (H) 08/18/2020 0207   ALT 273 (H) 08/18/2020 0207   ALKPHOS 121 08/18/2020 0207   BILITOT 2.6 (H) 08/18/2020 0207    GFRNONAA >60 08/18/2020 0207   Lipase     Component Value Date/Time   LIPASE 1,494 (H) 08/17/2020 0835       Studies/Results: DG Chest 1 View  Result Date: 08/18/2020 CLINICAL DATA:  Pneumonia EXAM: CHEST  1 VIEW COMPARISON:  None. FINDINGS: The heart size and mediastinal contours are within normal limits. Both lungs are clear. The visualized skeletal structures are unremarkable. IMPRESSION: No active disease. Electronically Signed   By: Helyn Numbers MD   On: 08/18/2020 01:39   CT Abdomen Pelvis W Contrast  Result Date: 08/16/2020 CLINICAL DATA:  Abdominal pain since this a.m. EXAM: CT ABDOMEN AND PELVIS WITH CONTRAST TECHNIQUE: Multidetector CT imaging of the abdomen and pelvis was performed using the standard protocol following bolus administration of intravenous contrast. CONTRAST:  OMNIPAQUE IOHEXOL 300 MG/ML  SOLN COMPARISON:  Same day abdominal ultrasound FINDINGS: Lower chest: Dependent atelectasis. Normal size heart. No significant pericardial effusion/thickening. Hepatobiliary: Diffuse hepatic steatosis. No suspicious hepatic lesion. Gallbladder is distended with wall thickening and pericholecystic fluid. The common duct measures upper limits of normal at 6 mm. No radiopaque choledocholithiasis visualized. Pancreas: Edematous appearance of the pancreatic parenchyma with peripancreatic stranding. No pancreatic ductal dilation. No focal areas of pancreatic nonenhancement. No walled off collections. Spleen: Normal in size without focal abnormality. Adrenals/Urinary Tract: Adrenal glands are  unremarkable. No hydronephrosis. Tiny hypodense bilateral renal lesions which are technically too small to accurately characterize but favored represent cysts. No solid enhancing renal lesion. Bladder is unremarkable for degree of distension. Stomach/Bowel: Stomach is grossly unremarkable. Mild wall thickening with submucosal enhancement and adjacent inflammatory stranding along the first and  second portion of the duodenum. No small bowel dilation. The appendix is not definitely visualized however there is no pericecal inflammation. The colon is predominately decompressed without suspicious wall thickening or mass like lesions. Vascular/Lymphatic: No significant vascular findings are present. No enlarged abdominal or pelvic lymph nodes. Reproductive: Uterus and bilateral adnexa are unremarkable. Other: No walled off fluid collections.  No pneumoperitoneum. Musculoskeletal: L5-S1 discogenic disease. No acute osseous abnormality. IMPRESSION: 1. Acute interstitial pancreatitis. No walled off fluid collections, evidence of pancreatic necrosis or pancreatic ductal dilation. 2. Distended gallbladder with wall thickening and pericholecystic fluid, suspicious for acute cholecystitis in the appropriate clinical setting. 3. Common bile duct measures upper limits of normal at 6 mm no intrahepatic biliary ductal dilation. No radiopaque choledocholithiasis visualized. 4. Mild wall thickening with submucosal enhancement and adjacent inflammatory stranding along the first and second portion of the duodenum, likely reactive. 5. Diffuse hepatic steatosis. Electronically Signed   By: Maudry Mayhew MD   On: 08/16/2020 22:49   MR ABDOMEN MRCP W WO CONTAST  Result Date: 08/17/2020 CLINICAL DATA:  Abdominal pain.  Acute pancreatitis. EXAM: MRI ABDOMEN WITHOUT AND WITH CONTRAST (INCLUDING MRCP) TECHNIQUE: Multiplanar multisequence MR imaging of the abdomen was performed both before and after the administration of intravenous contrast. Heavily T2-weighted images of the biliary and pancreatic ducts were obtained, and three-dimensional MRCP images were rendered by post processing. CONTRAST:  7mL GADAVIST GADOBUTROL 1 MMOL/ML IV SOLN COMPARISON:  CT on 08/16/2020 FINDINGS: Lower chest: Dependent bibasilar atelectasis noted. Hepatobiliary: Tiny sub-cm cyst seen in posterior right lobe. No hepatic masses identified. A few tiny  gallstones are seen. Gallbladder is distended and shows mild diffuse gallbladder wall thickening. No evidence of biliary ductal dilatation or choledocholithiasis. Pancreas: Diffuse pancreatic edema and moderate peripancreatic inflammatory changes are seen, consistent with acute pancreatitis. No evidence of pancreatic necrosis. No evidence of pancreatic mass or pancreatic ductal dilatation. No peripancreatic fluid collections identified. Spleen:  Within normal limits in size and appearance. Adrenals/Urinary Tract: No masses identified. Tiny bilateral renal cysts noted. No evidence of hydronephrosis. Stomach/Bowel: Mild diffuse small bowel dilatation is seen, likely due to reactive ileus. Vascular/Lymphatic: No pathologically enlarged lymph nodes identified. No abdominal aortic aneurysm. Other:  None. Musculoskeletal:  No suspicious bone lesions identified. IMPRESSION: Moderate acute pancreatitis. No evidence of pancreatic necrosis, pseudocyst, or other complication. Cholelithiasis. Distended gallbladder with mild diffuse wall thickening, which could be secondary to acute pancreatitis although acute cholecystitis cannot be excluded. Consider nuclear medicine HIDA scan for further evaluation if clinically warranted. No evidence of biliary ductal dilatation or choledocholithiasis. Mild diffuse small bowel dilatation, likely due to reactive ileus. Electronically Signed   By: Danae Orleans M.D.   On: 08/17/2020 19:31   US Abdomen Limited RUQ (LIVER/GB)  Result Date: 08/16/2020 CLINICAL DATA:  Gallbladder colic.  Elevated LFTs. EXAM: ULTRASOUND ABDOMEN LIMITED RIGHT UPPER QUADRANT COMPARISON:  None. FINDINGS: Gallbladder: Distended containing intraluminal sludge and stones, as well as non mobile gallstones in the gallbladder neck. Borderline wall thickness of 3 mm. Trace pericholecystic fluid. No sonographic Murphy sign noted by sonographer. Common bile duct: Diameter: 8-10 mm, no visualized choledocholithiasis. Liver:  Heterogeneous and mildly increased compared to right kidney. Probable geographic  areas of steatosis. No discrete focal lesion. Portal vein is patent on color Doppler imaging with normal direction of blood flow towards the liver. Other: None. IMPRESSION: 1. Distended gallbladder containing sludge and stones, as well as a non mobile stone in the gallbladder neck. Borderline wall thickness of 3 mm with trace pericholecystic fluid. Findings may represent acute cholecystitis in the appropriate clinical setting. 2. Dilated common bile duct at 8-10 mm, no visualized choledocholithiasis. 3. Heterogeneous increased hepatic parenchymal echogenicity most consistent with steatosis. Electronically Signed   By: Narda Rutherford M.D.   On: 08/16/2020 21:00    Anti-infectives: Anti-infectives (From admission, onward)   Start     Dose/Rate Route Frequency Ordered Stop   08/17/20 0730  piperacillin-tazobactam (ZOSYN) IVPB 3.375 g  Status:  Discontinued        3.375 g 12.5 mL/hr over 240 Minutes Intravenous Every 8 hours 08/17/20 0728 08/17/20 0843   08/17/20 0645  piperacillin-tazobactam (ZOSYN) IVPB 3.375 g  Status:  Discontinued        3.375 g 100 mL/hr over 30 Minutes Intravenous Every 8 hours 08/17/20 0644 08/17/20 0728   08/16/20 2115  piperacillin-tazobactam (ZOSYN) IVPB 3.375 g        3.375 g 100 mL/hr over 30 Minutes Intravenous  Once 08/16/20 2113 08/16/20 2200       Assessment/Plan Biliary pancreatitis  - lipase 4600 on admit, Tbili 6.8 - labs trending down  - RUQ [place] with gallstones, CT with interstitial pancreatitis without fluid collections or necrosis - GI following, MRCP was negative for choledocholithiasis - patient clinically does not have cholecystitis - pericholecystic fluid and wall thickening are likely reactive from pancreatitis; no need for further abx from a surgical standpoint  - pt not ready for OR today, will make NPO after MN and recheck early tomorrow AM for possible readiness.  Patient may not be ready until Friday or weekend but will continue to follow closely   FEN: CLD, IVF @125  cc/h VTE: SCDs, ok to have chemical DVT prophylaxis from a surgical standpoint  ID: Zosyn 4/25>4/26  LOS: 2 days    11-06-1967, [facility] 08/18/2020, 8:59 AM Please see Amion for pager number during day hours 7:00am-4:30pm

## 2020-08-19 DIAGNOSIS — K851 Biliary acute pancreatitis without necrosis or infection: Secondary | ICD-10-CM | POA: Diagnosis not present

## 2020-08-19 LAB — LIPASE, BLOOD: Lipase: 69 U/L — ABNORMAL HIGH (ref 11–51)

## 2020-08-19 LAB — COMPREHENSIVE METABOLIC PANEL
ALT: 172 U/L — ABNORMAL HIGH (ref 0–44)
AST: 50 U/L — ABNORMAL HIGH (ref 15–41)
Albumin: 2.7 g/dL — ABNORMAL LOW (ref 3.5–5.0)
Alkaline Phosphatase: 103 U/L (ref 38–126)
Anion gap: 9 (ref 5–15)
BUN: <5 mg/dL — ABNORMAL LOW (ref 6–20)
CO2: 26 mmol/L (ref 22–32)
Calcium: 8 mg/dL — ABNORMAL LOW (ref 8.9–10.3)
Chloride: 96 mmol/L — ABNORMAL LOW (ref 98–111)
Creatinine, Ser: 0.62 mg/dL (ref 0.44–1.00)
GFR, Estimated: >60 mL/min (ref >60)
Glucose, Bld: 89 mg/dL (ref 70–99)
Potassium: 3 mmol/L — ABNORMAL LOW (ref 3.5–5.1)
Sodium: 131 mmol/L — ABNORMAL LOW (ref 135–145)
Total Bilirubin: 2.5 mg/dL — ABNORMAL HIGH (ref 0.3–1.2)
Total Protein: 5.8 g/dL — ABNORMAL LOW (ref 6.5–8.1)

## 2020-08-19 LAB — PHOSPHORUS: Phosphorus: 2.3 mg/dL — ABNORMAL LOW (ref 2.5–4.6)

## 2020-08-19 LAB — URINE CULTURE: Culture: NO GROWTH

## 2020-08-19 LAB — MAGNESIUM: Magnesium: 1.6 mg/dL — ABNORMAL LOW (ref 1.7–2.4)

## 2020-08-19 MED ORDER — POTASSIUM CHLORIDE 2 MEQ/ML IV SOLN: INTRAVENOUS | Status: DC

## 2020-08-19 MED ORDER — POTASSIUM CHLORIDE IN NACL 20-0.9 MEQ/L-% IV SOLN
INTRAVENOUS | Status: DC
  Filled 2020-08-19 (×4): qty 1000

## 2020-08-19 MED ORDER — POTASSIUM CHLORIDE 10 MEQ/100ML IV SOLN
10.0000 meq | INTRAVENOUS | Status: AC
  Administered 2020-08-19 (×3): 10 meq via INTRAVENOUS
  Filled 2020-08-19 (×3): qty 100

## 2020-08-19 MED ORDER — MAGNESIUM SULFATE 2 GM/50ML IV SOLN
2.0000 g | Freq: Once | INTRAVENOUS | Status: AC
  Administered 2020-08-19: 2 g via INTRAVENOUS
  Filled 2020-08-19: qty 50

## 2020-08-19 MED ORDER — POLYETHYLENE GLYCOL 3350 17 G PO PACK
17.0000 g | PACK | Freq: Every day | ORAL | Status: DC
  Administered 2020-08-19 – 2020-08-21 (×2): 17 g via ORAL
  Filled 2020-08-19 (×2): qty 1

## 2020-08-19 NOTE — Progress Notes (Signed)
TRH night shift.  The patient's potassium decreased to 3.0 from 3.3 mmol/L.  LR KCl solution not available.  IVF changed to NS plus KCl 20 mEq at 100 mL/h.  KCl 10 mEq IVPB x2 doses ordered as well.  Sanda Klein, MD.

## 2020-08-19 NOTE — Progress Notes (Signed)
Patient ID: Kathleen Carney, female   DOB: 10/29/1975, 45 y.o.   MRN: [id number]  PROGRESS NOTE    Kathleen Carney  VPX:[id number] DOB: [birth year] DOA: 08/16/2020 PCP: Pcp, No   Brief Narrative:  45 year old female with no past medical history presented with nausea, vomiting and abdominal pain and was found to have acute gallstone pancreatitis with lipase of 4000 with leukocytosis,?  Acute cholecystitis along with dilated CBD.  General surgery was consulted.  Patient was started on IV antibiotics and fluids.  Assessment & Plan:   Acute gallstone pancreatitis Cholelithiasis -CT of the abdomen and pelvis notable for acute pancreatitis  -MRCP was negative for choledocholithiasis -Clinically improving with supportive care, LFTs trending down -General surgery following, plan for lap chole possibly tomorrow -Continue IV fluids, liquid diet  Hypokalemia -Replace  DVT prophylaxis: Lovenox Code Status: Full Family Communication: None at bedside Disposition Plan: Status is: Inpatient  Remains inpatient appropriate because:Inpatient level of care appropriate due to severity of illness   Dispo: The patient is from: Home              Anticipated d/c is to: Home              Patient currently is not medically stable to d/c.   Difficult to place patient No    Consultants: General surgery/GI  Procedures: None  Antimicrobials: Zosyn from 08/03/2020 onwards   Subjective: -Feels better overall, pain is slowly improving  Objective: Vitals:   08/18/20 2013 08/19/20 0403 08/19/20 0908 08/19/20 0908  BP: 119/70 117/70 126/81 126/81  Pulse: 87 95 88 88  Resp: 18 17 17 17   Temp: 98.5 F (36.9 C) 98.9 F (37.2 C) 98.3 F (36.8 C)   TempSrc: Oral Oral    SpO2: 95% 94% 97% 97%  Weight:      Height:        Intake/Output Summary (Last 24 hours) at 08/19/2020 1047 Last data filed at 08/19/2020 08/21/2020 Gross per 24 hour  Intake 3351.89 ml  Output 1300 ml  Net 2051.89 ml   Filed Weights    08/17/20 0724 08/17/20 0800  Weight: 68.9 kg 70.5 kg    Examination:  General exam: Pleasant young female laying in bed, AAOx3, no distress HEENT: No JVD, no icterus CVS: S1-S2, regular rate rhythm Lungs: Clear Abdomen: Soft, mildly distended, nontender, bowel sounds present Extremities: No edema   Data Reviewed: I have personally reviewed following labs and imaging studies  CBC: Recent Labs  Lab 08/16/20 1801 08/17/20 0645 08/18/20 0340  WBC 14.7* 13.4* 15.4*  NEUTROABS 13.1*  --  12.6*  HGB 14.2 12.8 11.6*  HCT 41.3 38.0 34.9*  MCV 88.2 89.8 91.4  PLT 298 207 173   Basic Metabolic Panel: Recent Labs  Lab 08/16/20 1801 08/17/20 0835 08/18/20 0207 08/19/20 0138  NA 134* 134* 134* 131*  K 3.5 3.3* 3.3* 3.0*  CL 96* 100 100 96*  CO2 22 25 25 26   GLUCOSE 155* 110* 106* 89  BUN 7 5* <5* <5*  CREATININE 0.72 0.67 0.59 0.62  CALCIUM 9.2 8.6* 8.3* 8.0*  MG  --   --  1.7  --    GFR: Estimated Creatinine Clearance: 84.4 mL/min (by C-G formula based on SCr of 0.62 mg/dL). Liver Function Tests: Recent Labs  Lab 08/16/20 1801 08/17/20 0835 08/18/20 0207 08/19/20 0138  AST 254* 169* 112* 50*  ALT 451* 337* 273* 172*  ALKPHOS 178* 147* 121 103  BILITOT 6.8* 3.0* 2.6* 2.5*  PROT 7.5 6.4*  6.1* 5.8*  ALBUMIN 4.3 3.5 3.1* 2.7*   Recent Labs  Lab 08/16/20 1801 08/17/20 0835 08/19/20 0138  LIPASE 4,607* 1,494* 69*   No results for input(s): AMMONIA in the last 168 hours. Coagulation Profile: No results for input(s): INR, PROTIME in the last 168 hours. Cardiac Enzymes: No results for input(s): CKTOTAL, CKMB, CKMBINDEX, TROPONINI in the last 168 hours. BNP (last 3 results) No results for input(s): PROBNP in the last 8760 hours. HbA1C: No results for input(s): HGBA1C in the last 72 hours. CBG: No results for input(s): GLUCAP in the last 168 hours. Lipid Profile: No results for input(s): CHOL, HDL, LDLCALC, TRIG, CHOLHDL, LDLDIRECT in the last 72  hours. Thyroid Function Tests: No results for input(s): TSH, T4TOTAL, FREET4, T3FREE, THYROIDAB in the last 72 hours. Anemia Panel: No results for input(s): VITAMINB12, FOLATE, FERRITIN, TIBC, IRON, RETICCTPCT in the last 72 hours. Sepsis Labs: No results for input(s): PROCALCITON, LATICACIDVEN in the last 168 hours.  Recent Results (from the past 240 hour(s))  SARS CORONAVIRUS 2 (TAT 6-24 HRS) Nasopharyngeal Nasopharyngeal Swab     Status: None   Collection Time: 08/17/20  1:15 AM   Specimen: Nasopharyngeal Swab  Result Value Ref Range Status   SARS Coronavirus 2 NEGATIVE NEGATIVE Final    Comment: (NOTE) SARS-CoV-2 target nucleic acids are NOT DETECTED.  The SARS-CoV-2 RNA is generally detectable in upper and lower respiratory specimens during the acute phase of infection. Negative results do not preclude SARS-CoV-2 infection, do not rule out co-infections with other pathogens, and should not be used as the sole basis for treatment or other patient management decisions. Negative results must be combined with clinical observations, patient history, and epidemiological information. The expected result is Negative.  Fact Sheet for Patients: HairSlick.no  Fact Sheet for Healthcare Providers: quierodirigir.com  This test is not yet approved or cleared by the [place] FDA and  has been authorized for detection and/or diagnosis of SARS-CoV-2 by FDA under an Emergency Use Authorization (EUA). This EUA will remain  in effect (meaning this test can be used) for the duration of the COVID-19 declaration under Se ction 564(b)(1) of the Act, 21 U.S.C. section 360bbb-3(b)(1), unless the authorization is terminated or revoked sooner.  Performed at [facility], [address] [place], [place] [address]   Culture, blood (routine x 2)     Status: None (Preliminary result)   Collection Time: 08/18/20  2:05 AM   Specimen:  BLOOD RIGHT HAND  Result Value Ref Range Status   Specimen Description BLOOD RIGHT HAND  Final   Special Requests   Final    BOTTLES DRAWN AEROBIC ONLY Blood Culture results may not be optimal due to an inadequate volume of blood received in culture bottles   Culture   Final    NO GROWTH 1 DAY Performed at [facility], [address] [place], [place] [address]    Report Status PENDING  Incomplete  Culture, blood (routine x 2)     Status: None (Preliminary result)   Collection Time: 08/18/20  2:07 AM   Specimen: BLOOD  Result Value Ref Range Status   Specimen Description BLOOD SITE NOT SPECIFIED  Final   Special Requests   Final    BOTTLES DRAWN AEROBIC ONLY Blood Culture results may not be optimal due to an inadequate volume of blood received in culture bottles   Culture   Final    NO GROWTH 1 DAY Performed at [facility],  [address], [place], [place] [address]    Report Status PENDING  Incomplete  Culture, Urine     Status: None   Collection Time: 08/18/20  6:20 AM   Specimen: Urine, Clean Catch  Result Value Ref Range Status   Specimen Description URINE, CLEAN CATCH  Final   Special Requests NONE  Final   Culture   Final    NO GROWTH Performed at [facility], [address], [place], [place] [address]    Report Status 08/19/2020 FINAL  Final    Radiology Studies: DG Chest 1 View  Result Date: 08/18/2020 CLINICAL DATA:  Pneumonia EXAM: CHEST  1 VIEW COMPARISON:  None. FINDINGS: The heart size and mediastinal contours are within normal limits. Both lungs are clear. The visualized skeletal structures are unremarkable. IMPRESSION: No active disease. Electronically Signed   By: Helyn Numbers MD   On: 08/18/2020 01:39   MR ABDOMEN MRCP W WO CONTAST  Result Date: 08/17/2020 CLINICAL DATA:  Abdominal pain.  Acute pancreatitis. EXAM: MRI ABDOMEN WITHOUT AND WITH CONTRAST (INCLUDING MRCP) TECHNIQUE: Multiplanar multisequence MR imaging of the abdomen was  performed both before and after the administration of intravenous contrast. Heavily T2-weighted images of the biliary and pancreatic ducts were obtained, and three-dimensional MRCP images were rendered by post processing. CONTRAST:  31mL GADAVIST GADOBUTROL 1 MMOL/ML IV SOLN COMPARISON:  CT on 08/16/2020 FINDINGS: Lower chest: Dependent bibasilar atelectasis noted. Hepatobiliary: Tiny sub-cm cyst seen in posterior right lobe. No hepatic masses identified. A few tiny gallstones are seen. Gallbladder is distended and shows mild diffuse gallbladder wall thickening. No evidence of biliary ductal dilatation or choledocholithiasis. Pancreas: Diffuse pancreatic edema and moderate peripancreatic inflammatory changes are seen, consistent with acute pancreatitis. No evidence of pancreatic necrosis. No evidence of pancreatic mass or pancreatic ductal dilatation. No peripancreatic fluid collections identified. Spleen:  Within normal limits in size and appearance. Adrenals/Urinary Tract: No masses identified. Tiny bilateral renal cysts noted. No evidence of hydronephrosis. Stomach/Bowel: Mild diffuse small bowel dilatation is seen, likely due to reactive ileus. Vascular/Lymphatic: No pathologically enlarged lymph nodes identified. No abdominal aortic aneurysm. Other:  None. Musculoskeletal:  No suspicious bone lesions identified. IMPRESSION: Moderate acute pancreatitis. No evidence of pancreatic necrosis, pseudocyst, or other complication. Cholelithiasis. Distended gallbladder with mild diffuse wall thickening, which could be secondary to acute pancreatitis although acute cholecystitis cannot be excluded. Consider nuclear medicine HIDA scan for further evaluation if clinically warranted. No evidence of biliary ductal dilatation or choledocholithiasis. Mild diffuse small bowel dilatation, likely due to reactive ileus. Electronically Signed   By: Danae Orleans M.D.   On: 08/17/2020 19:31    Scheduled Meds: . enoxaparin (LOVENOX)  injection  40 mg Subcutaneous Q24H  . polyethylene glycol  17 g Oral Daily   Continuous Infusions: . 0.9 % NaCl with KCl 20 mEq / L 100 mL/hr at 08/19/20 0600    Zannie Cove, MD [facility] 08/19/2020, 10:47 AM

## 2020-08-19 NOTE — Plan of Care (Signed)
  Problem: Education: Goal: Knowledge of General Education information will improve Description: Including pain rating scale, medication(s)/side effects and non-pharmacologic comfort measures Outcome: Progressing   Problem: Clinical Measurements: Goal: Ability to maintain clinical measurements within normal limits will improve Outcome: Progressing   Problem: Activity: Goal: Risk for activity intolerance will decrease Outcome: Progressing   Problem: Coping: Goal: Level of anxiety will decrease Outcome: Progressing   Problem: Pain Managment: Goal: General experience of comfort will improve Outcome: Progressing   

## 2020-08-19 NOTE — Plan of Care (Signed)
  Problem: Education: Goal: Knowledge of General Education information will improve Description: Including pain rating scale, medication(s)/side effects and non-pharmacologic comfort measures Outcome: Progressing   Problem: Health Behavior/Discharge Planning: Goal: Ability to manage health-related needs will improve Outcome: Progressing   Problem: Nutrition: Goal: Adequate nutrition will be maintained Outcome: Progressing   Problem: Elimination: Goal: Will not experience complications related to bowel motility Outcome: Progressing Goal: Will not experience complications related to urinary retention Outcome: Progressing   Problem: Pain Managment: Goal: General experience of comfort will improve Outcome: Progressing   Problem: Safety: Goal: Ability to remain free from injury will improve Outcome: Progressing   

## 2020-08-19 NOTE — Progress Notes (Signed)
Progress Note     Subjective: Patient tol liquids. Pain improving but still present, taking less pain medication. +flatus but no BM. Denies n/v.   Objective: Vital signs in last 24 hours: Temp:  [98 F (36.7 C)-98.9 F (37.2 C)] 98.9 F (37.2 C) (04/28 0403) Pulse Rate:  [83-95] 95 (04/28 0403) Resp:  [17-18] 17 (04/28 0403) BP: (117-129)/(70-79) 117/70 (04/28 0403) SpO2:  [94 %-97 %] 94 % (04/28 0403) Last BM Date: 08/16/20  Intake/Output from previous day: 04/27 0701 - 04/28 0700 In: 3651.9 [P.O.:1100; I.V.:2301.9; IV Piggyback:250] Out: 1300 [Urine:1300] Intake/Output this shift: No intake/output data recorded.  PE: General: pleasant, WD,WNfemale who is laying in bed and lethargic but easily awakens HEENT: Sclera areanicteric.  Heart: regular, rate, and rhythm.  Lungs: CTAB, no wheezes, rhonchi, or rales noted. Respiratory effort nonlabored Abd: soft,improving ttp in epigastrium without peritonitis, NDbut some fullness in epigastric abdomen, +BS, no masses, hernias, or organomegaly    Lab Results:  Recent Labs    08/17/20 0645 08/18/20 0340  WBC 13.4* 15.4*  HGB 12.8 11.6*  HCT 38.0 34.9*  PLT 207 173   BMET Recent Labs    08/18/20 0207 08/19/20 0138  NA 134* 131*  K 3.3* 3.0*  CL 100 96*  CO2 25 26  GLUCOSE 106* 89  BUN <5* <5*  CREATININE 0.59 0.62  CALCIUM 8.3* 8.0*   PT/INR No results for input(s): LABPROT, INR in the last 72 hours. CMP     Component Value Date/Time   NA 131 (L) 08/19/2020 0138   K 3.0 (L) 08/19/2020 0138   CL 96 (L) 08/19/2020 0138   CO2 26 08/19/2020 0138   GLUCOSE 89 08/19/2020 0138   BUN <5 (L) 08/19/2020 0138   CREATININE 0.62 08/19/2020 0138   CALCIUM 8.0 (L) 08/19/2020 0138   PROT 5.8 (L) 08/19/2020 0138   ALBUMIN 2.7 (L) 08/19/2020 0138   AST 50 (H) 08/19/2020 0138   ALT 172 (H) 08/19/2020 0138   ALKPHOS 103 08/19/2020 0138   BILITOT 2.5 (H) 08/19/2020 0138   GFRNONAA >60 08/19/2020 0138   Lipase      Component Value Date/Time   LIPASE 69 (H) 08/19/2020 0138       Studies/Results: DG Chest 1 View  Result Date: 08/18/2020 CLINICAL DATA:  Pneumonia EXAM: CHEST  1 VIEW COMPARISON:  None. FINDINGS: The heart size and mediastinal contours are within normal limits. Both lungs are clear. The visualized skeletal structures are unremarkable. IMPRESSION: No active disease. Electronically Signed   By: Helyn Numbers MD   On: 08/18/2020 01:39   MR ABDOMEN MRCP W WO CONTAST  Result Date: 08/17/2020 CLINICAL DATA:  Abdominal pain.  Acute pancreatitis. EXAM: MRI ABDOMEN WITHOUT AND WITH CONTRAST (INCLUDING MRCP) TECHNIQUE: Multiplanar multisequence MR imaging of the abdomen was performed both before and after the administration of intravenous contrast. Heavily T2-weighted images of the biliary and pancreatic ducts were obtained, and three-dimensional MRCP images were rendered by post processing. CONTRAST:  63mL GADAVIST GADOBUTROL 1 MMOL/ML IV SOLN COMPARISON:  CT on 08/16/2020 FINDINGS: Lower chest: Dependent bibasilar atelectasis noted. Hepatobiliary: Tiny sub-cm cyst seen in posterior right lobe. No hepatic masses identified. A few tiny gallstones are seen. Gallbladder is distended and shows mild diffuse gallbladder wall thickening. No evidence of biliary ductal dilatation or choledocholithiasis. Pancreas: Diffuse pancreatic edema and moderate peripancreatic inflammatory changes are seen, consistent with acute pancreatitis. No evidence of pancreatic necrosis. No evidence of pancreatic mass or pancreatic ductal dilatation. No peripancreatic  fluid collections identified. Spleen:  Within normal limits in size and appearance. Adrenals/Urinary Tract: No masses identified. Tiny bilateral renal cysts noted. No evidence of hydronephrosis. Stomach/Bowel: Mild diffuse small bowel dilatation is seen, likely due to reactive ileus. Vascular/Lymphatic: No pathologically enlarged lymph nodes identified. No abdominal  aortic aneurysm. Other:  None. Musculoskeletal:  No suspicious bone lesions identified. IMPRESSION: Moderate acute pancreatitis. No evidence of pancreatic necrosis, pseudocyst, or other complication. Cholelithiasis. Distended gallbladder with mild diffuse wall thickening, which could be secondary to acute pancreatitis although acute cholecystitis cannot be excluded. Consider nuclear medicine HIDA scan for further evaluation if clinically warranted. No evidence of biliary ductal dilatation or choledocholithiasis. Mild diffuse small bowel dilatation, likely due to reactive ileus. Electronically Signed   By: Danae Orleans M.D.   On: 08/17/2020 19:31    Anti-infectives: Anti-infectives (From admission, onward)   Start     Dose/Rate Route Frequency Ordered Stop   08/17/20 0730  piperacillin-tazobactam (ZOSYN) IVPB 3.375 g  Status:  Discontinued        3.375 g 12.5 mL/hr over 240 Minutes Intravenous Every 8 hours 08/17/20 0728 08/17/20 0843   08/17/20 0645  piperacillin-tazobactam (ZOSYN) IVPB 3.375 g  Status:  Discontinued        3.375 g 100 mL/hr over 30 Minutes Intravenous Every 8 hours 08/17/20 0644 08/17/20 0728   08/16/20 2115  piperacillin-tazobactam (ZOSYN) IVPB 3.375 g        3.375 g 100 mL/hr over 30 Minutes Intravenous  Once 08/16/20 2113 08/16/20 2200       Assessment/Plan Biliary pancreatitis  - lipase 4600 on admit, Tbili 6.8 - labs trending down  - RUQ [place] with gallstones, CT with interstitial pancreatitis without fluid collections or necrosis - GI following, MRCP was negative for choledocholithiasis - patient clinically does not have cholecystitis - pericholecystic fluid and wall thickening are likely reactive from pancreatitis; no need for further abx from a surgical standpoint  - ttp improving but still not resolved, will recheck tomorrow for OR readiness - NPO after MN  FEN: FLD, IVF @100  cc/h VTE: SCDs, LMWH ID: Zosyn 4/25>4/26  LOS: 3 days    11-06-1967,  [facility] 08/19/2020, 8:08 AM Please see Amion for pager number during day hours 7:00am-4:30pm

## 2020-08-20 ENCOUNTER — Encounter (HOSPITAL_COMMUNITY)
Admission: EM | Disposition: A | Discharge status: Home / Self Care | Payer: Self-pay | Source: Home / Self Care | Attending: Internal Medicine

## 2020-08-20 ENCOUNTER — Inpatient Hospital Stay (HOSPITAL_COMMUNITY): Payer: BC Managed Care – PPO | Admitting: Anesthesiology

## 2020-08-20 DIAGNOSIS — K851 Biliary acute pancreatitis without necrosis or infection: Secondary | ICD-10-CM | POA: Diagnosis not present

## 2020-08-20 HISTORY — PX: CHOLECYSTECTOMY: SHX55

## 2020-08-20 LAB — CBC
HCT: 32.9 % — ABNORMAL LOW (ref 36.0–46.0)
Hemoglobin: 11.1 g/dL — ABNORMAL LOW (ref 12.0–15.0)
MCH: 30.7 pg (ref 26.0–34.0)
MCHC: 33.7 g/dL (ref 30.0–36.0)
MCV: 91.1 fL (ref 80.0–100.0)
Platelets: 186 10*3/uL (ref 150–400)
RBC: 3.61 MIL/uL — ABNORMAL LOW (ref 3.87–5.11)
RDW: 12.1 % (ref 11.5–15.5)
WBC: 14.2 10*3/uL — ABNORMAL HIGH (ref 4.0–10.5)
nRBC: 0 % (ref 0.0–0.2)

## 2020-08-20 LAB — COMPREHENSIVE METABOLIC PANEL
ALT: 110 U/L — ABNORMAL HIGH (ref 0–44)
AST: 21 U/L (ref 15–41)
Albumin: 2.6 g/dL — ABNORMAL LOW (ref 3.5–5.0)
Alkaline Phosphatase: 107 U/L (ref 38–126)
Anion gap: 7 (ref 5–15)
BUN: <5 mg/dL — ABNORMAL LOW (ref 6–20)
CO2: 25 mmol/L (ref 22–32)
Calcium: 8.1 mg/dL — ABNORMAL LOW (ref 8.9–10.3)
Chloride: 101 mmol/L (ref 98–111)
Creatinine, Ser: 0.55 mg/dL (ref 0.44–1.00)
GFR, Estimated: >60 mL/min (ref >60)
Glucose, Bld: 93 mg/dL (ref 70–99)
Potassium: 3.5 mmol/L (ref 3.5–5.1)
Sodium: 133 mmol/L — ABNORMAL LOW (ref 135–145)
Total Bilirubin: 1.8 mg/dL — ABNORMAL HIGH (ref 0.3–1.2)
Total Protein: 5.9 g/dL — ABNORMAL LOW (ref 6.5–8.1)

## 2020-08-20 LAB — SURGICAL PCR SCREEN
MRSA, PCR: NEGATIVE
Staphylococcus aureus: NEGATIVE

## 2020-08-20 SURGERY — LAPAROSCOPIC CHOLECYSTECTOMY WITH INTRAOPERATIVE CHOLANGIOGRAM
Anesthesia: General | Site: Abdomen

## 2020-08-20 MED ORDER — DEXAMETHASONE SODIUM PHOSPHATE 10 MG/ML IJ SOLN
INTRAMUSCULAR | Status: DC | PRN
  Administered 2020-08-20: 10 mg via INTRAVENOUS

## 2020-08-20 MED ORDER — SODIUM CHLORIDE 0.9 % IR SOLN
Status: DC | PRN
  Administered 2020-08-20: 1000 mL

## 2020-08-20 MED ORDER — ORAL CARE MOUTH RINSE: 15.0000 mL | Freq: Once | OROMUCOSAL | Status: AC

## 2020-08-20 MED ORDER — FENTANYL CITRATE (PF) 100 MCG/2ML IJ SOLN: 25.0000 ug | INTRAMUSCULAR | Status: DC | PRN

## 2020-08-20 MED ORDER — FENTANYL CITRATE (PF) 250 MCG/5ML IJ SOLN
INTRAMUSCULAR | Status: AC
  Filled 2020-08-20: qty 5

## 2020-08-20 MED ORDER — LIDOCAINE 2% (20 MG/ML) 5 ML SYRINGE
INTRAMUSCULAR | Status: DC | PRN
  Administered 2020-08-20: 100 mg via INTRAVENOUS

## 2020-08-20 MED ORDER — FENTANYL CITRATE (PF) 250 MCG/5ML IJ SOLN
INTRAMUSCULAR | Status: DC | PRN
  Administered 2020-08-20: 150 ug via INTRAVENOUS

## 2020-08-20 MED ORDER — CHLORHEXIDINE GLUCONATE 0.12 % MT SOLN
OROMUCOSAL | Status: AC
  Administered 2020-08-20: 15 mL via OROMUCOSAL
  Filled 2020-08-20: qty 15

## 2020-08-20 MED ORDER — OXYCODONE HCL 5 MG PO TABS: 5.0000 mg | ORAL_TABLET | Freq: Once | ORAL | Status: AC | PRN

## 2020-08-20 MED ORDER — BUPIVACAINE-EPINEPHRINE (PF) 0.25% -1:200000 IJ SOLN
INTRAMUSCULAR | Status: AC
  Filled 2020-08-20: qty 30

## 2020-08-20 MED ORDER — PROPOFOL 10 MG/ML IV BOLUS
INTRAVENOUS | Status: DC | PRN
  Administered 2020-08-20: 150 mg via INTRAVENOUS

## 2020-08-20 MED ORDER — CHLORHEXIDINE GLUCONATE 0.12 % MT SOLN: 15.0000 mL | Freq: Once | OROMUCOSAL | Status: AC

## 2020-08-20 MED ORDER — POTASSIUM CHLORIDE IN NACL 20-0.9 MEQ/L-% IV SOLN
INTRAVENOUS | Status: DC
  Filled 2020-08-20: qty 1000

## 2020-08-20 MED ORDER — OXYCODONE HCL 5 MG/5ML PO SOLN: 5.0000 mg | Freq: Once | ORAL | Status: AC | PRN

## 2020-08-20 MED ORDER — OXYCODONE HCL 5 MG PO TABS
ORAL_TABLET | ORAL | Status: AC
  Administered 2020-08-20: 5 mg via ORAL
  Filled 2020-08-20: qty 1

## 2020-08-20 MED ORDER — BUPIVACAINE-EPINEPHRINE 0.25% -1:200000 IJ SOLN
INTRAMUSCULAR | Status: DC | PRN
  Administered 2020-08-20: 14 mL

## 2020-08-20 MED ORDER — MIDAZOLAM HCL 2 MG/2ML IJ SOLN
INTRAMUSCULAR | Status: DC | PRN
  Administered 2020-08-20: 2 mg via INTRAVENOUS

## 2020-08-20 MED ORDER — DEXMEDETOMIDINE (PRECEDEX) IN NS 20 MCG/5ML (4 MCG/ML) IV SYRINGE
PREFILLED_SYRINGE | INTRAVENOUS | Status: DC | PRN
  Administered 2020-08-20: 20 ug via INTRAVENOUS

## 2020-08-20 MED ORDER — LACTATED RINGERS IV SOLN: INTRAVENOUS | Status: DC

## 2020-08-20 MED ORDER — ACETAMINOPHEN 500 MG PO TABS
1000.0000 mg | ORAL_TABLET | Freq: Four times a day (QID) | ORAL | Status: DC
  Administered 2020-08-20 – 2020-08-21 (×2): 1000 mg via ORAL
  Filled 2020-08-20 (×3): qty 2

## 2020-08-20 MED ORDER — SUCCINYLCHOLINE CHLORIDE 200 MG/10ML IV SOSY
PREFILLED_SYRINGE | INTRAVENOUS | Status: DC | PRN
  Administered 2020-08-20: 100 mg via INTRAVENOUS

## 2020-08-20 MED ORDER — ROCURONIUM BROMIDE 10 MG/ML (PF) SYRINGE
PREFILLED_SYRINGE | INTRAVENOUS | Status: DC | PRN
  Administered 2020-08-20: 50 mg via INTRAVENOUS

## 2020-08-20 MED ORDER — OXYCODONE HCL 5 MG PO TABS: 5.0000 mg | ORAL_TABLET | ORAL | Status: DC | PRN

## 2020-08-20 MED ORDER — MIDAZOLAM HCL 2 MG/2ML IJ SOLN
INTRAMUSCULAR | Status: AC
  Filled 2020-08-20: qty 2

## 2020-08-20 MED ORDER — PROPOFOL 10 MG/ML IV BOLUS
INTRAVENOUS | Status: AC
  Filled 2020-08-20: qty 20

## 2020-08-20 MED ORDER — SUGAMMADEX SODIUM 200 MG/2ML IV SOLN
INTRAVENOUS | Status: DC | PRN
  Administered 2020-08-20: 200 mg via INTRAVENOUS

## 2020-08-20 MED ORDER — CEFAZOLIN SODIUM-DEXTROSE 2-4 GM/100ML-% IV SOLN
2.0000 g | Freq: Once | INTRAVENOUS | Status: AC
  Administered 2020-08-20: 2 g via INTRAVENOUS
  Filled 2020-08-20 (×2): qty 100

## 2020-08-20 MED ORDER — 0.9 % SODIUM CHLORIDE (POUR BTL) OPTIME
TOPICAL | Status: DC | PRN
  Administered 2020-08-20: 1000 mL

## 2020-08-20 MED ORDER — ONDANSETRON HCL 4 MG/2ML IJ SOLN
4.0000 mg | Freq: Once | INTRAMUSCULAR | Status: DC | PRN
Start: 2020-08-20 — End: 2020-08-20

## 2020-08-20 SURGICAL SUPPLY — 39 items
APPLIER CLIP 5 13 M/L LIGAMAX5 (MISCELLANEOUS) ×2
CANISTER SUCT 3000ML PPV (MISCELLANEOUS) ×2 IMPLANT
CHLORAPREP W/TINT 26 (MISCELLANEOUS) ×2 IMPLANT
CLIP APPLIE 5 13 M/L LIGAMAX5 (MISCELLANEOUS) ×1 IMPLANT
COVER MAYO STAND STRL (DRAPES) IMPLANT
COVER SURGICAL LIGHT HANDLE (MISCELLANEOUS) ×2 IMPLANT
COVER WAND RF STERILE (DRAPES) ×2 IMPLANT
DERMABOND ADVANCED (GAUZE/BANDAGES/DRESSINGS) ×1
DERMABOND ADVANCED .7 DNX12 (GAUZE/BANDAGES/DRESSINGS) ×1 IMPLANT
DRAPE C-ARM 42X120 X-RAY (DRAPES) IMPLANT
ELECT REM PT RETURN 9FT ADLT (ELECTROSURGICAL) ×2
ELECTRODE REM PT RTRN 9FT ADLT (ELECTROSURGICAL) ×1 IMPLANT
GLOVE BIO SURGEON STRL SZ7 (GLOVE) ×2 IMPLANT
GLOVE BIOGEL PI IND STRL 7.5 (GLOVE) ×1 IMPLANT
GLOVE BIOGEL PI INDICATOR 7.5 (GLOVE) ×1
GOWN STRL REUS W/ TWL LRG LVL3 (GOWN DISPOSABLE) ×3 IMPLANT
GOWN STRL REUS W/TWL LRG LVL3 (GOWN DISPOSABLE) ×3
GRASPER SUT TROCAR 14GX15 (MISCELLANEOUS) ×2 IMPLANT
KIT BASIN OR (CUSTOM PROCEDURE TRAY) ×2 IMPLANT
KIT TURNOVER KIT B (KITS) ×2 IMPLANT
NS IRRIG 1000ML POUR BTL (IV SOLUTION) ×2 IMPLANT
PAD ARMBOARD 7.5X6 YLW CONV (MISCELLANEOUS) ×2 IMPLANT
POUCH RETRIEVAL ECOSAC 10 (ENDOMECHANICALS) ×1 IMPLANT
POUCH RETRIEVAL ECOSAC 10MM (ENDOMECHANICALS) ×1
SCISSORS LAP 5X35 DISP (ENDOMECHANICALS) ×2 IMPLANT
SET CHOLANGIOGRAPH 5 50 .035 (SET/KITS/TRAYS/PACK) IMPLANT
SET IRRIG TUBING LAPAROSCOPIC (IRRIGATION / IRRIGATOR) ×2 IMPLANT
SET TUBE SMOKE EVAC HIGH FLOW (TUBING) ×2 IMPLANT
SLEEVE ENDOPATH XCEL 5M (ENDOMECHANICALS) ×4 IMPLANT
SPECIMEN JAR SMALL (MISCELLANEOUS) ×2 IMPLANT
STRIP CLOSURE SKIN 1/2X4 (GAUZE/BANDAGES/DRESSINGS) ×2 IMPLANT
SUT MNCRL AB 4-0 PS2 18 (SUTURE) ×2 IMPLANT
SUT VICRYL 0 UR6 27IN ABS (SUTURE) ×2 IMPLANT
TOWEL GREEN STERILE (TOWEL DISPOSABLE) ×2 IMPLANT
TOWEL GREEN STERILE FF (TOWEL DISPOSABLE) ×2 IMPLANT
TRAY LAPAROSCOPIC MC (CUSTOM PROCEDURE TRAY) ×2 IMPLANT
TROCAR XCEL BLUNT TIP 100MML (ENDOMECHANICALS) ×2 IMPLANT
TROCAR XCEL NON-BLD 5MMX100MML (ENDOMECHANICALS) ×2 IMPLANT
WATER STERILE IRR 1000ML POUR (IV SOLUTION) ×2 IMPLANT

## 2020-08-20 NOTE — Progress Notes (Signed)
Patient ID: Kathleen Carney, female   DOB: 1975-09-29, 45 y.o.   MRN: [id number]  PROGRESS NOTE    Kathleen Carney  AYT:[id number] DOB: January 07, 1976 DOA: 08/16/2020 PCP: Pcp, No   Brief Narrative:  45 year old female with no past medical history presented with nausea, vomiting and abdominal pain and was found to have acute gallstone pancreatitis with lipase of 4000 with leukocytosis  Assessment & Plan:   Acute gallstone pancreatitis Cholelithiasis -CT of the abdomen and pelvis notable for acute pancreatitis  -MRCP was negative for choledocholithiasis -Clinically improving with supportive care, LFTs trending down -General surgery following,  -Lap chole today -Labs in a.m.  Hypokalemia -Replaced  DVT prophylaxis: Lovenox Code Status: Full Family Communication: None at bedside Disposition Plan: Status is: Inpatient  Remains inpatient appropriate because:Inpatient level of care appropriate due to severity of illness   Dispo: The patient is from: Home              Anticipated d/c is to: Home              Patient currently is not medically stable to d/c.   Difficult to place patient No    Consultants: General surgery/GI  Procedures: Lap chole today  Antimicrobials: Zosyn from 08/03/2020 onwards   Subjective: -Pain continues to improve, no nausea or vomiting, going down for surgery today  Objective: Vitals:   08/19/20 1750 08/19/20 1951 08/20/20 0518 08/20/20 1021  BP: 127/83 110/64 124/79   Pulse: 91 80 81   Resp: 18 20 16    Temp: 98.4 F (36.9 C) 99.8 F (37.7 C) 98.3 F (36.8 C)   TempSrc:  Oral Oral   SpO2: 99% 97% 97%   Weight:    70.5 kg  Height:    5\' 3"  (1.6 m)    Intake/Output Summary (Last 24 hours) at 08/20/2020 1138 Last data filed at 08/20/2020 1108 Gross per 24 hour  Intake 3199.29 ml  Output 3300 ml  Net -100.71 ml   Filed Weights   08/17/20 0724 08/17/20 0800 08/20/20 1021  Weight: 68.9 kg 70.5 kg 70.5 kg    Examination:  General exam: Pleasant  young female, laying in bed, AAOx3, nondistressed HEENT: No JVD or icterus CVS: S1-S2, regular rate rhythm Lungs: Clear bilaterally Abdomen: Soft nontender, nondistended, bowel sounds present Extremities: No edema   Data Reviewed: I have personally reviewed following labs and imaging studies  CBC: Recent Labs  Lab 08/16/20 1801 08/17/20 0645 08/18/20 0340 08/20/20 0436  WBC 14.7* 13.4* 15.4* 14.2*  NEUTROABS 13.1*  --  12.6*  --   HGB 14.2 12.8 11.6* 11.1*  HCT 41.3 38.0 34.9* 32.9*  MCV 88.2 89.8 91.4 91.1  PLT 298 207 173 186   Basic Metabolic Panel: Recent Labs  Lab 08/16/20 1801 08/17/20 0835 08/18/20 0207 08/19/20 0138 08/19/20 0401 08/20/20 0436  NA 134* 134* 134* 131*  --  133*  K 3.5 3.3* 3.3* 3.0*  --  3.5  CL 96* 100 100 96*  --  101  CO2 22 25 25 26   --  25  GLUCOSE 155* 110* 106* 89  --  93  BUN 7 5* <5* <5*  --  <5*  CREATININE 0.72 0.67 0.59 0.62  --  0.55  CALCIUM 9.2 8.6* 8.3* 8.0*  --  8.1*  MG  --   --  1.7  --  1.6*  --   PHOS  --   --   --   --  2.3*  --  GFR: Estimated Creatinine Clearance: 84.4 mL/min (by C-G formula based on SCr of 0.55 mg/dL). Liver Function Tests: Recent Labs  Lab 08/16/20 1801 08/17/20 0835 08/18/20 0207 08/19/20 0138 08/20/20 0436  AST 254* 169* 112* 50* 21  ALT 451* 337* 273* 172* 110*  ALKPHOS 178* 147* 121 103 107  BILITOT 6.8* 3.0* 2.6* 2.5* 1.8*  PROT 7.5 6.4* 6.1* 5.8* 5.9*  ALBUMIN 4.3 3.5 3.1* 2.7* 2.6*   Recent Labs  Lab 08/16/20 1801 08/17/20 0835 08/19/20 0138  LIPASE 4,607* 1,494* 69*   No results for input(s): AMMONIA in the last 168 hours. Coagulation Profile: No results for input(s): INR, PROTIME in the last 168 hours. Cardiac Enzymes: No results for input(s): CKTOTAL, CKMB, CKMBINDEX, TROPONINI in the last 168 hours. BNP (last 3 results) No results for input(s): PROBNP in the last 8760 hours. HbA1C: No results for input(s): HGBA1C in the last 72 hours. CBG: No results for  input(s): GLUCAP in the last 168 hours. Lipid Profile: No results for input(s): CHOL, HDL, LDLCALC, TRIG, CHOLHDL, LDLDIRECT in the last 72 hours. Thyroid Function Tests: No results for input(s): TSH, T4TOTAL, FREET4, T3FREE, THYROIDAB in the last 72 hours. Anemia Panel: No results for input(s): VITAMINB12, FOLATE, FERRITIN, TIBC, IRON, RETICCTPCT in the last 72 hours. Sepsis Labs: No results for input(s): PROCALCITON, LATICACIDVEN in the last 168 hours.  Recent Results (from the past 240 hour(s))  SARS CORONAVIRUS 2 (TAT 6-24 HRS) Nasopharyngeal Nasopharyngeal Swab     Status: None   Collection Time: 08/17/20  1:15 AM   Specimen: Nasopharyngeal Swab  Result Value Ref Range Status   SARS Coronavirus 2 NEGATIVE NEGATIVE Final    Comment: (NOTE) SARS-CoV-2 target nucleic acids are NOT DETECTED.  The SARS-CoV-2 RNA is generally detectable in upper and lower respiratory specimens during the acute phase of infection. Negative results do not preclude SARS-CoV-2 infection, do not rule out co-infections with other pathogens, and should not be used as the sole basis for treatment or other patient management decisions. Negative results must be combined with clinical observations, patient history, and epidemiological information. The expected result is Negative.  Fact Sheet for Patients: HairSlick.no  Fact Sheet for Healthcare Providers: quierodirigir.com  This test is not yet approved or cleared by the [place] FDA and  has been authorized for detection and/or diagnosis of SARS-CoV-2 by FDA under an Emergency Use Authorization (EUA). This EUA will remain  in effect (meaning this test can be used) for the duration of the COVID-19 declaration under Se ction 564(b)(1) of the Act, 21 U.S.C. section 360bbb-3(b)(1), unless the authorization is terminated or revoked sooner.  Performed at [facility], [address]  [place], [place] [address]   Culture, blood (routine x 2)     Status: None (Preliminary result)   Collection Time: 08/18/20  2:05 AM   Specimen: BLOOD RIGHT HAND  Result Value Ref Range Status   Specimen Description BLOOD RIGHT HAND  Final   Special Requests   Final    BOTTLES DRAWN AEROBIC ONLY Blood Culture results may not be optimal due to an inadequate volume of blood received in culture bottles   Culture   Final    NO GROWTH 2 DAYS Performed at [facility], [address], [place], [place] [address]    Report Status PENDING  Incomplete  Culture, blood (routine x 2)     Status: None (Preliminary result)   Collection Time: 08/18/20  2:07 AM   Specimen: BLOOD  Result Value Ref Range Status   Specimen Description BLOOD SITE NOT SPECIFIED  Final   Special Requests   Final    BOTTLES DRAWN AEROBIC ONLY Blood Culture results may not be optimal due to an inadequate volume of blood received in culture bottles   Culture   Final    NO GROWTH 2 DAYS Performed at [facility], [address] [place], [place] [address]    Report Status PENDING  Incomplete  Culture, Urine     Status: None   Collection Time: 08/18/20  6:20 AM   Specimen: Urine, Clean Catch  Result Value Ref Range Status   Specimen Description URINE, CLEAN CATCH  Final   Special Requests NONE  Final   Culture   Final    NO GROWTH Performed at [facility], [address], [place], [place] [address]    Report Status 08/19/2020 FINAL  Final  Surgical pcr screen     Status: None   Collection Time: 08/20/20  5:22 AM   Specimen: Nasal Mucosa; Nasal Swab  Result Value Ref Range Status   MRSA, PCR NEGATIVE NEGATIVE Final   Staphylococcus aureus NEGATIVE NEGATIVE Final    Comment: (NOTE) The Xpert SA Assay (FDA approved for NASAL specimens in patients 60 years of age and older), is one component of a comprehensive surveillance program. It is not intended to diagnose infection nor to guide or monitor  treatment. Performed at [facility], [address] [place], [place] [address]     Radiology Studies: No results found.  Scheduled Meds: . [MAR Hold] enoxaparin (LOVENOX) injection  40 mg Subcutaneous Q24H  . [MAR Hold] polyethylene glycol  17 g Oral Daily   Continuous Infusions: . 0.9 % NaCl with KCl 20 mEq / L 100 mL/hr at 08/20/20 0148  . lactated ringers 10 mL/hr at 08/20/20 1054    Zannie Cove, MD [facility] 08/20/2020, 11:38 AM

## 2020-08-20 NOTE — Anesthesia Preprocedure Evaluation (Signed)
Anesthesia Evaluation  Patient identified by MRN, date of birth, ID band Patient awake    Reviewed: Allergy & Precautions, NPO status , Patient's Chart, lab work & pertinent test results  Airway Mallampati: II  TM Distance: >3 FB Neck ROM: Full    Dental  (+) Teeth Intact, Dental Advisory Given   Pulmonary    breath sounds clear to auscultation       Cardiovascular  Rhythm:Regular Rate:Normal     Neuro/Psych    GI/Hepatic   Endo/Other    Renal/GU      Musculoskeletal   Abdominal   Peds  Hematology   Anesthesia Other Findings   Reproductive/Obstetrics                             Anesthesia Physical Anesthesia Plan  ASA: III  Anesthesia Plan: General   Post-op Pain Management:    Induction: Intravenous  PONV Risk Score and Plan: Ondansetron and Dexamethasone  Airway Management Planned: Oral ETT  Additional Equipment:   Intra-op Plan:   Post-operative Plan:   Informed Consent: I have reviewed the patients History and Physical, chart, labs and discussed the procedure including the risks, benefits and alternatives for the proposed anesthesia with the patient or authorized representative who has indicated his/her understanding and acceptance.     Dental advisory given  Plan Discussed with: CRNA and Anesthesiologist  Anesthesia Plan Comments:         Anesthesia Quick Evaluation

## 2020-08-20 NOTE — Anesthesia Postprocedure Evaluation (Signed)
Anesthesia Post Note  Patient: Kathleen Carney  Procedure(s) Performed: LAPAROSCOPIC CHOLECYSTECTOMY (N/A Abdomen)     Patient location during evaluation: PACU Anesthesia Type: General Level of consciousness: awake and alert Pain management: pain level controlled Vital Signs Assessment: post-procedure vital signs reviewed and stable Respiratory status: spontaneous breathing, nonlabored ventilation, respiratory function stable and patient connected to nasal cannula oxygen Cardiovascular status: blood pressure returned to baseline and stable Postop Assessment: no apparent nausea or vomiting Anesthetic complications: no   No complications documented.  Last Vitals:  Vitals:   08/20/20 1253 08/20/20 1842  BP: 108/70 107/71  Pulse: 76 72  Resp: 18 18  Temp: 37 C 36.7 C  SpO2: 94% 95%    Last Pain:  Vitals:   08/20/20 1842  TempSrc: Oral  PainSc:                  Siarra Gilkerson COKER

## 2020-08-20 NOTE — Discharge Instructions (Signed)
[facility] LAPAROSCOPIC SURGERY: POST OP INSTRUCTIONS Always review your discharge instruction sheet given to you by the facility where your surgery was performed. IF YOU HAVE DISABILITY OR FAMILY LEAVE FORMS, YOU MUST BRING THEM TO THE OFFICE FOR PROCESSING.   DO NOT GIVE THEM TO YOUR DOCTOR.  PAIN CONTROL  1. First take acetaminophen (Tylenol) AND/or ibuprofen (Advil) to control your pain after surgery.  Follow directions on package.  Taking acetaminophen (Tylenol) and/or ibuprofen (Advil) regularly after surgery will help to control your pain and lower the amount of prescription pain medication you may need.  You should not take more than 3,000 mg (3 grams) of acetaminophen (Tylenol) in 24 hours.  You should not take ibuprofen (Advil), aleve, motrin, naprosyn or other NSAIDS if you have a history of stomach ulcers or chronic kidney disease.  2. A prescription for pain medication may be given to you upon discharge.  Take your pain medication as prescribed, if you still have uncontrolled pain after taking acetaminophen (Tylenol) or ibuprofen (Advil). 3. Use ice packs to help control pain. 4. If you need a refill on your pain medication, please contact your pharmacy.  They will contact our office to request authorization. Prescriptions will not be filled after 5pm or on week-ends.  HOME MEDICATIONS 5. Take your usually prescribed medications unless otherwise directed.  DIET 6. You should follow a light diet the first few days after arrival home.  Be sure to include lots of fluids daily. Avoid fatty, fried foods.   CONSTIPATION 7. It is common to experience some constipation after surgery and if you are taking pain medication.  Increasing fluid intake and taking a stool softener (such as Colace) will usually help or prevent this problem from occurring.  A mild laxative (Milk of Magnesia or Miralax) should be taken according to package instructions if there are no bowel  movements after 48 hours.  WOUND/INCISION CARE 8. Most patients will experience some swelling and bruising in the area of the incisions.  Ice packs will help.  Swelling and bruising can take several days to resolve.  9. Unless discharge instructions indicate otherwise, follow guidelines below  a. STERI-STRIPS - you may remove your outer bandages 48 hours after surgery, and you may shower at that time.  You have steri-strips (small skin tapes) in place directly over the incision.  These strips should be left on the skin for 7-10 days.   b. DERMABOND/SKIN GLUE - you may shower in 24 hours.  The glue will flake off over the next 2-3 weeks. 10. Any sutures or staples will be removed at the office during your follow-up visit.  ACTIVITIES 11. You may resume regular (light) daily activities beginning the next day--such as daily self-care, walking, climbing stairs--gradually increasing activities as tolerated.  You may have sexual intercourse when it is comfortable.  Refrain from any heavy lifting or straining until approved by your doctor. a. You may drive when you are no longer taking prescription pain medication, you can comfortably wear a seatbelt, and you can safely maneuver your car and apply brakes.  FOLLOW-UP 12. You should see your doctor in the office for a follow-up appointment approximately 2-3 weeks after your surgery.  You should have been given your post-op/follow-up appointment when your surgery was scheduled.  If you did not receive a post-op/follow-up appointment, make sure that you call for this appointment within a day or two after you arrive home to insure a convenient appointment time.  OTHER   INSTRUCTIONS ° ° °WHEN TO CALL YOUR DOCTOR: °1. Fever over 101.0 °2. Inability to urinate °3. Continued bleeding from incision. °4. Increased pain, redness, or drainage from the incision. °5. Increasing abdominal pain ° °The clinic staff is available to answer your questions during regular business  hours.  Please don’t hesitate to call and ask to speak to one of the nurses for clinical concerns.  If you have a medical emergency, go to the nearest emergency room or call 911.  A surgeon from [facility] is always on call at the hospital. °[address], [address], [place], [place]  [address] ? [address] [address], [place],    [address] °[phone] ? [phone] ? FAX [phone] °Web site: [email or web address] ° °

## 2020-08-20 NOTE — Transfer of Care (Signed)
Immediate Anesthesia Transfer of Care Note  Patient: Kathleen Carney  Procedure(s) Performed: LAPAROSCOPIC CHOLECYSTECTOMY (N/A Abdomen)  Patient Location: PACU  Anesthesia Type:General  Level of Consciousness: awake, alert  and oriented  Airway & Oxygen Therapy: Patient Spontanous Breathing and Patient connected to nasal cannula oxygen  Post-op Assessment: Report given to RN, Post -op Vital signs reviewed and stable and Patient moving all extremities X 4  Post vital signs: Reviewed and stable  Last Vitals:  Vitals Value Taken Time  BP 115/67 08/20/20 1203  Temp    Pulse 80 08/20/20 1205  Resp 21 08/20/20 1205  SpO2 97 % 08/20/20 1205  Vitals shown include unvalidated device data.  Last Pain:  Vitals:   08/20/20 0529  TempSrc:   PainSc: 6       Patients Stated Pain Goal: 0 (08/20/20 0101)  Complications: No complications documented.

## 2020-08-20 NOTE — Anesthesia Procedure Notes (Signed)
Procedure Name: Intubation Date/Time: 08/20/2020 11:06 AM Performed by: Mariea Clonts, CRNA Pre-anesthesia Checklist: Patient identified, Emergency Drugs available, Suction available and Patient being monitored Patient Re-evaluated:Patient Re-evaluated prior to induction Oxygen Delivery Method: Circle System Utilized Preoxygenation: Pre-oxygenation with 100% oxygen Induction Type: IV induction Ventilation: Mask ventilation without difficulty Laryngoscope Size: Mac and 3 Grade View: Grade I Tube type: Oral Tube size: 7.0 mm Number of attempts: 1 Airway Equipment and Method: Stylet and Oral airway Placement Confirmation: ETT inserted through vocal cords under direct vision,  positive ETCO2 and breath sounds checked- equal and bilateral Tube secured with: Tape Dental Injury: Teeth and Oropharynx as per pre-operative assessment

## 2020-08-20 NOTE — Plan of Care (Signed)
  Problem: Activity: Goal: Risk for activity intolerance will decrease Outcome: Progressing   

## 2020-08-20 NOTE — Op Note (Signed)
Preoperative diagnosis:Gallstone pancreatitis Postoperative diagnosis: Same as above Surgery: Laparoscopic cholecystectomy Surgeon: Dr. Harden Mo Asst:Kelly Laural Benes, PA-C Anesthesia: General Estimated blood loss: Minimal Drains: None Specimens: Gallbladder and contents to pathology Complications: None Special count was correct x2 at end of operation Disposition to recovery stable condition  Indications:45 yof with gallstone pancreatitis that has resolved.   Discussed proceeding with lap chole today.   Procedure: After informed consent was obtained the patient was taken to the operating room. She was given antibiotics. SCDs were in place. She was placed under general anesthesia without complication. She was prepped and draped in the standard sterile surgical fashion. Surgical timeout was then performed.  I infiltrated Marcaine below her umbilicus.I made a vertical incision. I dissected down and grasped the fascia. I incised this sharply and entered the peritoneum bluntly. There was no evidence of an entry injury. I placed a 0 Vicryl pursestring suture through the fascia. I then inserted a Hassan trocar and insufflated the abdomen to 15 mmHg pressure. I then inserted 3 further 5 mm trocars in epigastrium and right side of the abdomen under direct vision without complication. I then retracted her gallbladder cephalad. I did aspirate the gallbladder as it was tense.  I then was able to retract it laterally. I eventually was able to dissect the triangle and obtain the critical view of safety. I took the gallbladder off the cystic plate of the liver to ensure that this was the correct view. I then clipped the cystic duct with 3 clips and divided it. I left 2 clips in place. The duct was very short and I visualized the CBD that was being tented. The duct was viable and the clips completely traversed the duct. I treated the artery in a similar fashion. I then took the  gallbladder off the liver bed and placed it in a retrieval bag. I then tied my pursestring down. I placed an additional 0 vicryl suture with the suture passer to completely obliterate the defect as well.Hemostasis was observed. I irrigated and this was clear. I then desufflated the abdomen remove the remaining trocars. I closed these with 4-0 Monocryl and glue. She tolerated this well was extubated and transferred to recovery stable.

## 2020-08-20 NOTE — Progress Notes (Signed)
Progress Note     Subjective: Patient reports pain continues to improve. Denies n/v, passing flatus.   Objective: Vital signs in last 24 hours: Temp:  [98.3 F (36.8 C)-99.8 F (37.7 C)] 98.3 F (36.8 C) (04/29 0518) Pulse Rate:  [80-91] 81 (04/29 0518) Resp:  [16-20] 16 (04/29 0518) BP: (110-127)/(64-83) 124/79 (04/29 0518) SpO2:  [97 %-99 %] 97 % (04/29 0518) Last BM Date: 08/19/20  Intake/Output from previous day: 04/28 0701 - 04/29 0700 In: 3099.3 [P.O.:720; I.V.:2379.3] Out: 3300 [Urine:3300] Intake/Output this shift: No intake/output data recorded.  PE: General: pleasant, WD,WNfemale who is laying in bed and lethargic but easily awakens HEENT: Sclera areanicteric.  Heart: regular, rate, and rhythm.  Lungs: CTAB, no wheezes, rhonchi, or rales noted. Respiratory effort nonlabored Abd: soft,improving ttp in epigastrium without peritonitis, NDbut some fullness in epigastric abdomen, +BS, no masses, hernias, or organomegaly    Lab Results:  Recent Labs    08/18/20 0340 08/20/20 0436  WBC 15.4* 14.2*  HGB 11.6* 11.1*  HCT 34.9* 32.9*  PLT 173 186   BMET Recent Labs    08/19/20 0138 08/20/20 0436  NA 131* 133*  K 3.0* 3.5  CL 96* 101  CO2 26 25  GLUCOSE 89 93  BUN <5* <5*  CREATININE 0.62 0.55  CALCIUM 8.0* 8.1*   PT/INR No results for input(s): LABPROT, INR in the last 72 hours. CMP     Component Value Date/Time   NA 133 (L) 08/20/2020 0436   K 3.5 08/20/2020 0436   CL 101 08/20/2020 0436   CO2 25 08/20/2020 0436   GLUCOSE 93 08/20/2020 0436   BUN <5 (L) 08/20/2020 0436   CREATININE 0.55 08/20/2020 0436   CALCIUM 8.1 (L) 08/20/2020 0436   PROT 5.9 (L) 08/20/2020 0436   ALBUMIN 2.6 (L) 08/20/2020 0436   AST 21 08/20/2020 0436   ALT 110 (H) 08/20/2020 0436   ALKPHOS 107 08/20/2020 0436   BILITOT 1.8 (H) 08/20/2020 0436   GFRNONAA >60 08/20/2020 0436   Lipase     Component Value Date/Time   LIPASE 69 (H) 08/19/2020 0138        Studies/Results: No results found.  Anti-infectives: Anti-infectives (From admission, onward)   Start     Dose/Rate Route Frequency Ordered Stop   08/17/20 0730  piperacillin-tazobactam (ZOSYN) IVPB 3.375 g  Status:  Discontinued        3.375 g 12.5 mL/hr over 240 Minutes Intravenous Every 8 hours 08/17/20 0728 08/17/20 0843   08/17/20 0645  piperacillin-tazobactam (ZOSYN) IVPB 3.375 g  Status:  Discontinued        3.375 g 100 mL/hr over 30 Minutes Intravenous Every 8 hours 08/17/20 0644 08/17/20 0728   08/16/20 2115  piperacillin-tazobactam (ZOSYN) IVPB 3.375 g        3.375 g 100 mL/hr over 30 Minutes Intravenous  Once 08/16/20 2113 08/16/20 2200       Assessment/Plan Biliary pancreatitis  - lipase 4600 on admit, Tbili 6.8 -labs trending down - RUQ [place] with gallstones, CT with interstitial pancreatitis without fluid collections or necrosis -GI following, MRCP was negative for choledocholithiasis - patient clinically does not have cholecystitis - pericholecystic fluid and wall thickening are likely reactive from pancreatitis; no need for further abx from a surgical standpoint  -ttp improving - MD to see and make final call but possibly OR today for lap chole   FEN:FLD, IVF@100  cc/h VTE: SCDs, LMWH ID: Zosyn 4/25>4/26  LOS: 4 days    Tresa Endo  Jolayne Panther, [facility] 08/20/2020, 8:16 AM Please see Amion for pager number during day hours 7:00am-4:30pm

## 2020-08-21 ENCOUNTER — Encounter (HOSPITAL_COMMUNITY): Payer: Self-pay | Admitting: General Surgery

## 2020-08-21 DIAGNOSIS — K851 Biliary acute pancreatitis without necrosis or infection: Secondary | ICD-10-CM | POA: Diagnosis not present

## 2020-08-21 LAB — COMPREHENSIVE METABOLIC PANEL WITH GFR
ALT: 89 U/L — ABNORMAL HIGH (ref 0–44)
AST: 22 U/L (ref 15–41)
Albumin: 2.7 g/dL — ABNORMAL LOW (ref 3.5–5.0)
Alkaline Phosphatase: 110 U/L (ref 38–126)
Anion gap: 12 (ref 5–15)
BUN: 5 mg/dL — ABNORMAL LOW (ref 6–20)
CO2: 24 mmol/L (ref 22–32)
Calcium: 8.9 mg/dL (ref 8.9–10.3)
Chloride: 102 mmol/L (ref 98–111)
Creatinine, Ser: 0.52 mg/dL (ref 0.44–1.00)
GFR, Estimated: >60 mL/min
Glucose, Bld: 121 mg/dL — ABNORMAL HIGH (ref 70–99)
Potassium: 4 mmol/L (ref 3.5–5.1)
Sodium: 138 mmol/L (ref 135–145)
Total Bilirubin: 1.1 mg/dL (ref 0.3–1.2)
Total Protein: 6.3 g/dL — ABNORMAL LOW (ref 6.5–8.1)

## 2020-08-21 LAB — CBC
HCT: 35.7 % — ABNORMAL LOW (ref 36.0–46.0)
Hemoglobin: 11.8 g/dL — ABNORMAL LOW (ref 12.0–15.0)
MCH: 29.9 pg (ref 26.0–34.0)
MCHC: 33.1 g/dL (ref 30.0–36.0)
MCV: 90.4 fL (ref 80.0–100.0)
Platelets: 246 K/uL (ref 150–400)
RBC: 3.95 MIL/uL (ref 3.87–5.11)
RDW: 12 % (ref 11.5–15.5)
WBC: 15.2 K/uL — ABNORMAL HIGH (ref 4.0–10.5)
nRBC: 0 % (ref 0.0–0.2)

## 2020-08-21 MED ORDER — POLYETHYLENE GLYCOL 3350 17 G PO PACK: 17.0000 g | PACK | Freq: Every day | ORAL | 0 refills | Status: AC | PRN

## 2020-08-21 MED ORDER — ACETAMINOPHEN 500 MG PO TABS: 500.0000 mg | ORAL_TABLET | Freq: Four times a day (QID) | ORAL | 0 refills | Status: AC | PRN

## 2020-08-21 MED ORDER — TRAMADOL HCL 50 MG PO TABS: 50.0000 mg | ORAL_TABLET | Freq: Four times a day (QID) | ORAL | 0 refills | Status: AC | PRN

## 2020-08-21 NOTE — Progress Notes (Signed)
   Progress Note  1 Day Post-Op  Subjective: Doing great after lap chole.  No pain.  Tolerating diet with no nausea vomiting and walking around her room  Objective: Vital signs in last 24 hours: Temp:  [97.5 F (36.4 C)-98.8 F (37.1 C)] 98.2 F (36.8 C) (04/30 0926) Pulse Rate:  [27-85] 57 (04/30 0926) Resp:  [18-20] 18 (04/30 0926) BP: (103-116)/(66-73) 116/73 (04/30 0926) SpO2:  [94 %-100 %] 98 % (04/30 0926) Last BM Date: 08/20/20  Intake/Output from previous day: 04/29 0701 - 04/30 0700 In: 1240 [P.O.:240; I.V.:900; IV Piggyback:100] Out: 20 [Blood:20] Intake/Output this shift: No intake/output data recorded.  PE: Abd: soft, NT, ND, +BS, incisions c/d/i    Lab Results:  Recent Labs    08/20/20 0436 08/21/20 0310  WBC 14.2* 15.2*  HGB 11.1* 11.8*  HCT 32.9* 35.7*  PLT 186 246   BMET Recent Labs    08/20/20 0436 08/21/20 0310  NA 133* 138  K 3.5 4.0  CL 101 102  CO2 25 24  GLUCOSE 93 121*  BUN <5* 5*  CREATININE 0.55 0.52  CALCIUM 8.1* 8.9   PT/INR No results for input(s): LABPROT, INR in the last 72 hours. CMP     Component Value Date/Time   NA 138 08/21/2020 0310   K 4.0 08/21/2020 0310   CL 102 08/21/2020 0310   CO2 24 08/21/2020 0310   GLUCOSE 121 (H) 08/21/2020 0310   BUN 5 (L) 08/21/2020 0310   CREATININE 0.52 08/21/2020 0310   CALCIUM 8.9 08/21/2020 0310   PROT 6.3 (L) 08/21/2020 0310   ALBUMIN 2.7 (L) 08/21/2020 0310   AST 22 08/21/2020 0310   ALT 89 (H) 08/21/2020 0310   ALKPHOS 110 08/21/2020 0310   BILITOT 1.1 08/21/2020 0310   GFRNONAA >60 08/21/2020 0310   Lipase     Component Value Date/Time   LIPASE 69 (H) 08/19/2020 0138       Studies/Results: No results found.  Anti-infectives: Anti-infectives (From admission, onward)   Start     Dose/Rate Route Frequency Ordered Stop   08/20/20 0930  ceFAZolin (ANCEF) IVPB 2g/100 mL premix        2 g 200 mL/hr over 30 Minutes Intravenous  Once 08/20/20 0839 08/20/20  1138   08/17/20 0730  piperacillin-tazobactam (ZOSYN) IVPB 3.375 g  Status:  Discontinued        3.375 g 12.5 mL/hr over 240 Minutes Intravenous Every 8 hours 08/17/20 0728 08/17/20 0843   08/17/20 0645  piperacillin-tazobactam (ZOSYN) IVPB 3.375 g  Status:  Discontinued        3.375 g 100 mL/hr over 30 Minutes Intravenous Every 8 hours 08/17/20 0644 08/17/20 0728   08/16/20 2115  piperacillin-tazobactam (ZOSYN) IVPB 3.375 g        3.375 g 100 mL/hr over 30 Minutes Intravenous  Once 08/16/20 2113 08/16/20 2200       Assessment/Plan POD 1, s/p lap chole for Biliary pancreatitis, Dr. Dwain Sarna 4/29  - doing great -stable for DC home -follow up in 3 weeks in the office -DC instructions discussed with patient   FEN:FLD VTE: SCDs, LMWH ID: Zosyn 4/25>4/26  LOS: 5 days    [facility] 08/21/2020, 11:14 AM Please see Amion for pager number during day hours 7:00am-4:30pm

## 2020-08-21 NOTE — Plan of Care (Signed)
  Problem: Pain Managment: Goal: General experience of comfort will improve Outcome: Progressing   

## 2020-08-21 NOTE — Progress Notes (Signed)
DISCHARGE NOTE HOME Raelyn Moilanen to be discharged to home per MD order. Discussed prescriptions and follow up appointments with the patient. Prescriptions given to patient; medication list explained in detail. Patient verbalized understanding.  Skin clean, dry and intact without evidence of skin break down, no evidence of skin tears noted. IV catheter discontinued intact. Site without signs and symptoms of complications. Dressing and pressure applied. Pt denies pain at the site currently. No complaints noted.  Patient free of lines, drains, and wounds.   An After Visit Summary (AVS) was printed and given to the patient. Patient escorted via wheelchair, and discharged home via private auto.  [place], Kem Kays, RN

## 2020-08-21 NOTE — Plan of Care (Signed)
  Problem: Activity: Goal: Risk for activity intolerance will decrease Outcome: Progressing   

## 2020-08-23 LAB — CULTURE, BLOOD (ROUTINE X 2)
Culture: NO GROWTH
Culture: NO GROWTH

## 2020-08-23 LAB — SURGICAL PATHOLOGY

## 2020-09-03 NOTE — Discharge Summary (Signed)
Physician Discharge Summary  Nalia Honeycutt WUJ:[id number] DOB: 10-Dec-1975 DOA: 08/16/2020  PCP: Pcp, No  Admit date: 08/16/2020 Discharge date: 08/21/2020  Time spent: 35 minutes  Recommendations for Outpatient Follow-up:  Gen Surgery on 5/17 for post op FU   Discharge Diagnoses:  Principal Problem:   Acute gallstone pancreatitis   Cholelithiasis   Leukocytosis   Acute cholecystitis   Discharge Condition: stable  Diet recommendation: low fat diet  Filed Weights   08/17/20 0724 08/17/20 0800 08/20/20 1021  Weight: 68.9 kg 70.5 kg 70.5 kg    History of present illness:  45 year old female with no past medical history presented with nausea, vomiting and abdominal pain and was found to have acute gallstone pancreatitis with lipase of 4000 with leukocytosis,?  Acute cholecystitis along with dilated CBD.   Hospital Course:   Acute gallstone pancreatitis Cholelithiasis -CT of the abdomen and pelvis notable for acute pancreatitis  -MRCP was negative for choledocholithiasis -Clinically improved with supportive care, LFTs trending down -General surgery consulted, underwent lap cholecystectomy by Dr. Dwain Sarna on 4/29 -Postop course was uneventful, discharged home in a stable condition, tolerating diet, advised follow-up with general surgery  Hypokalemia -Replaced  Procedure Surgery: Laparoscopic cholecystectomy 4/29 Surgeon: Dr. Harden Mo Asst:Kelly Norge, PA-C  Discharge Exam: Vitals:   08/21/20 0446 08/21/20 0926  BP: 103/67 116/73  Pulse: (!) 52 (!) 57  Resp:  18  Temp:  98.2 F (36.8 C)  SpO2:  98%    General: AAOx3 Cardiovascular: S1-S2, regular rate rhythm Respiratory: Clear  Discharge Instructions   Discharge Instructions    Diet - low sodium heart healthy   Complete by: As directed    Discharge wound care:   Complete by: As directed    Routine   Increase activity slowly   Complete by: As directed      Allergies as of 08/21/2020   No  Known Allergies     Medication List    TAKE these medications   acetaminophen 500 MG tablet Commonly known as: TYLENOL Take 1 tablet (500 mg total) by mouth every 6 (six) hours as needed for mild pain or headache.     ASK your doctor about these medications   polyethylene glycol 17 g packet Commonly known as: MIRALAX / GLYCOLAX Take 17 g by mouth daily as needed for up to 5 days for mild constipation. Ask about: Should I take this medication?   traMADol 50 MG tablet Commonly known as: Ultram Take 1 tablet (50 mg total) by mouth every 6 (six) hours as needed for up to 5 days for moderate pain or severe pain. Ask about: Should I take this medication?            Discharge Care Instructions  (From admission, onward)         Start     Ordered   08/21/20 0000  Discharge wound care:       Comments: Routine   08/21/20 1111         No Known Allergies  Follow-up Information    Sur[facility], [facility]. Go on 09/07/2020.   Specialty: General Surgery Why: 11:30 AM. Please arrive 30 min prior to appointment time. Bring photo ID and insurance information.  Contact information: [address] [place] [address] [phone]                The results of significant diagnostics from this hospitalization (including imaging, microbiology, ancillary and laboratory) are listed below for reference.  Significant Diagnostic Studies: DG Chest 1 View  Result Date: 08/18/2020 CLINICAL DATA:  Pneumonia EXAM: CHEST  1 VIEW COMPARISON:  None. FINDINGS: The heart size and mediastinal contours are within normal limits. Both lungs are clear. The visualized skeletal structures are unremarkable. IMPRESSION: No active disease. Electronically Signed   By: Helyn Numbers MD   On: 08/18/2020 01:39   CT Abdomen Pelvis W Contrast  Result Date: 08/16/2020 CLINICAL DATA:  Abdominal pain since this a.m. EXAM: CT ABDOMEN AND PELVIS WITH CONTRAST TECHNIQUE: Multidetector CT  imaging of the abdomen and pelvis was performed using the standard protocol following bolus administration of intravenous contrast. CONTRAST:  OMNIPAQUE IOHEXOL 300 MG/ML  SOLN COMPARISON:  Same day abdominal ultrasound FINDINGS: Lower chest: Dependent atelectasis. Normal size heart. No significant pericardial effusion/thickening. Hepatobiliary: Diffuse hepatic steatosis. No suspicious hepatic lesion. Gallbladder is distended with wall thickening and pericholecystic fluid. The common duct measures upper limits of normal at 6 mm. No radiopaque choledocholithiasis visualized. Pancreas: Edematous appearance of the pancreatic parenchyma with peripancreatic stranding. No pancreatic ductal dilation. No focal areas of pancreatic nonenhancement. No walled off collections. Spleen: Normal in size without focal abnormality. Adrenals/Urinary Tract: Adrenal glands are unremarkable. No hydronephrosis. Tiny hypodense bilateral renal lesions which are technically too small to accurately characterize but favored represent cysts. No solid enhancing renal lesion. Bladder is unremarkable for degree of distension. Stomach/Bowel: Stomach is grossly unremarkable. Mild wall thickening with submucosal enhancement and adjacent inflammatory stranding along the first and second portion of the duodenum. No small bowel dilation. The appendix is not definitely visualized however there is no pericecal inflammation. The colon is predominately decompressed without suspicious wall thickening or mass like lesions. Vascular/Lymphatic: No significant vascular findings are present. No enlarged abdominal or pelvic lymph nodes. Reproductive: Uterus and bilateral adnexa are unremarkable. Other: No walled off fluid collections.  No pneumoperitoneum. Musculoskeletal: L5-S1 discogenic disease. No acute osseous abnormality. IMPRESSION: 1. Acute interstitial pancreatitis. No walled off fluid collections, evidence of pancreatic necrosis or pancreatic ductal  dilation. 2. Distended gallbladder with wall thickening and pericholecystic fluid, suspicious for acute cholecystitis in the appropriate clinical setting. 3. Common bile duct measures upper limits of normal at 6 mm no intrahepatic biliary ductal dilation. No radiopaque choledocholithiasis visualized. 4. Mild wall thickening with submucosal enhancement and adjacent inflammatory stranding along the first and second portion of the duodenum, likely reactive. 5. Diffuse hepatic steatosis. Electronically Signed   By: Maudry Mayhew MD   On: 08/16/2020 22:49   MR ABDOMEN MRCP W WO CONTAST  Result Date: 08/17/2020 CLINICAL DATA:  Abdominal pain.  Acute pancreatitis. EXAM: MRI ABDOMEN WITHOUT AND WITH CONTRAST (INCLUDING MRCP) TECHNIQUE: Multiplanar multisequence MR imaging of the abdomen was performed both before and after the administration of intravenous contrast. Heavily T2-weighted images of the biliary and pancreatic ducts were obtained, and three-dimensional MRCP images were rendered by post processing. CONTRAST:  51mL GADAVIST GADOBUTROL 1 MMOL/ML IV SOLN COMPARISON:  CT on 08/16/2020 FINDINGS: Lower chest: Dependent bibasilar atelectasis noted. Hepatobiliary: Tiny sub-cm cyst seen in posterior right lobe. No hepatic masses identified. A few tiny gallstones are seen. Gallbladder is distended and shows mild diffuse gallbladder wall thickening. No evidence of biliary ductal dilatation or choledocholithiasis. Pancreas: Diffuse pancreatic edema and moderate peripancreatic inflammatory changes are seen, consistent with acute pancreatitis. No evidence of pancreatic necrosis. No evidence of pancreatic mass or pancreatic ductal dilatation. No peripancreatic fluid collections identified. Spleen:  Within normal limits in size and appearance. Adrenals/Urinary Tract: No masses identified.  Tiny bilateral renal cysts noted. No evidence of hydronephrosis. Stomach/Bowel: Mild diffuse small bowel dilatation is seen, likely due to  reactive ileus. Vascular/Lymphatic: No pathologically enlarged lymph nodes identified. No abdominal aortic aneurysm. Other:  None. Musculoskeletal:  No suspicious bone lesions identified. IMPRESSION: Moderate acute pancreatitis. No evidence of pancreatic necrosis, pseudocyst, or other complication. Cholelithiasis. Distended gallbladder with mild diffuse wall thickening, which could be secondary to acute pancreatitis although acute cholecystitis cannot be excluded. Consider nuclear medicine HIDA scan for further evaluation if clinically warranted. No evidence of biliary ductal dilatation or choledocholithiasis. Mild diffuse small bowel dilatation, likely due to reactive ileus. Electronically Signed   By: Danae Orleans M.D.   On: 08/17/2020 19:31   US Abdomen Limited RUQ (LIVER/GB)  Result Date: 08/16/2020 CLINICAL DATA:  Gallbladder colic.  Elevated LFTs. EXAM: ULTRASOUND ABDOMEN LIMITED RIGHT UPPER QUADRANT COMPARISON:  None. FINDINGS: Gallbladder: Distended containing intraluminal sludge and stones, as well as non mobile gallstones in the gallbladder neck. Borderline wall thickness of 3 mm. Trace pericholecystic fluid. No sonographic Murphy sign noted by sonographer. Common bile duct: Diameter: 8-10 mm, no visualized choledocholithiasis. Liver: Heterogeneous and mildly increased compared to right kidney. Probable geographic areas of steatosis. No discrete focal lesion. Portal vein is patent on color Doppler imaging with normal direction of blood flow towards the liver. Other: None. IMPRESSION: 1. Distended gallbladder containing sludge and stones, as well as a non mobile stone in the gallbladder neck. Borderline wall thickness of 3 mm with trace pericholecystic fluid. Findings may represent acute cholecystitis in the appropriate clinical setting. 2. Dilated common bile duct at 8-10 mm, no visualized choledocholithiasis. 3. Heterogeneous increased hepatic parenchymal echogenicity most consistent with steatosis.  Electronically Signed   By: Narda Rutherford M.D.   On: 08/16/2020 21:00    Microbiology: No results found for this or any previous visit (from the past 240 hour(s)).   Labs: Basic Metabolic Panel: No results for input(s): NA, K, CL, CO2, GLUCOSE, BUN, CREATININE, CALCIUM, MG, PHOS in the last 168 hours. Liver Function Tests: No results for input(s): AST, ALT, ALKPHOS, BILITOT, PROT, ALBUMIN in the last 168 hours. No results for input(s): LIPASE, AMYLASE in the last 168 hours. No results for input(s): AMMONIA in the last 168 hours. CBC: No results for input(s): WBC, NEUTROABS, HGB, HCT, MCV, PLT in the last 168 hours. Cardiac Enzymes: No results for input(s): CKTOTAL, CKMB, CKMBINDEX, TROPONINI in the last 168 hours. BNP: BNP (last 3 results) No results for input(s): BNP in the last 8760 hours.  ProBNP (last 3 results) No results for input(s): PROBNP in the last 8760 hours.  CBG: No results for input(s): GLUCAP in the last 168 hours.     Signed:  Zannie Cove MD.  [facility] 09/03/2020, 2:24 PM

## 2020-11-16 ENCOUNTER — Encounter (HOSPITAL_BASED_OUTPATIENT_CLINIC_OR_DEPARTMENT_OTHER): Payer: Self-pay | Admitting: Emergency Medicine

## 2020-11-16 ENCOUNTER — Emergency Department (HOSPITAL_BASED_OUTPATIENT_CLINIC_OR_DEPARTMENT_OTHER)
Admission: EM | Admit: 2020-11-16 | Discharge: 2020-11-16 | Disposition: A | Discharge status: Home / Self Care | Payer: BC Managed Care – PPO | Attending: Emergency Medicine | Admitting: Emergency Medicine

## 2020-11-16 ENCOUNTER — Other Ambulatory Visit: Payer: Self-pay

## 2020-11-16 DIAGNOSIS — S0990XA Unspecified injury of head, initial encounter: Secondary | ICD-10-CM | POA: Diagnosis not present

## 2020-11-16 DIAGNOSIS — Y92410 Unspecified street and highway as the place of occurrence of the external cause: Secondary | ICD-10-CM | POA: Diagnosis not present

## 2020-11-16 DIAGNOSIS — S060X0A Concussion without loss of consciousness, initial encounter: Secondary | ICD-10-CM | POA: Diagnosis not present

## 2020-11-16 MED ORDER — KETOROLAC TROMETHAMINE 30 MG/ML IJ SOLN
30.0000 mg | Freq: Once | INTRAMUSCULAR | Status: AC
  Administered 2020-11-16: 30 mg via INTRAMUSCULAR
  Filled 2020-11-16: qty 1
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
## 2020-11-30 ENCOUNTER — Telehealth: Payer: Self-pay | Admitting: Family Medicine
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
## 2020-11-30 NOTE — Telephone Encounter (Signed)
Appointment scheduled.

## 2020-11-30 NOTE — Telephone Encounter (Signed)
Scheduled 12/07/2020
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
## 2020-12-07 ENCOUNTER — Encounter: Payer: Self-pay | Admitting: Family Medicine

## 2020-12-07 ENCOUNTER — Ambulatory Visit: Payer: Self-pay

## 2020-12-07 ENCOUNTER — Ambulatory Visit (INDEPENDENT_AMBULATORY_CARE_PROVIDER_SITE_OTHER): Payer: BC Managed Care – PPO | Admitting: Family Medicine

## 2020-12-07 ENCOUNTER — Other Ambulatory Visit: Payer: Self-pay

## 2020-12-07 VITALS — BP 110/74 | HR 81 | Ht 63.0 in | Wt 157.0 lb

## 2020-12-07 DIAGNOSIS — R413 Other amnesia: Secondary | ICD-10-CM | POA: Diagnosis not present

## 2020-12-07 DIAGNOSIS — M25511 Pain in right shoulder: Secondary | ICD-10-CM | POA: Diagnosis not present

## 2020-12-07 DIAGNOSIS — S060X0A Concussion without loss of consciousness, initial encounter: Secondary | ICD-10-CM

## 2020-12-07 MED ORDER — NORTRIPTYLINE HCL 25 MG PO CAPS: 25.0000 mg | ORAL_CAPSULE | Freq: Every day | ORAL | 2 refills | Status: DC

## 2020-12-07 NOTE — Patient Instructions (Addendum)
Thank you for coming in today.   I've referred you to Physical Therapy.  Let us know if you don't hear from them in one week.   Plan for Speech Therapy  Try nortryptline at bedtime. This may help with headache and sleep.  Stop it if its more annoying than helpful. Let me know  Recheck with me in 3 week or so.   Ok to take Omeag 3 again. Fish oil or Kill oil is a good source.

## 2020-12-21 ENCOUNTER — Ambulatory Visit: Payer: BC Managed Care – PPO | Admitting: Speech Pathology

## 2020-12-21 ENCOUNTER — Ambulatory Visit: Payer: BC Managed Care – PPO | Attending: Family Medicine

## 2020-12-21 ENCOUNTER — Other Ambulatory Visit: Payer: Self-pay

## 2020-12-21 ENCOUNTER — Encounter: Payer: Self-pay | Admitting: Speech Pathology

## 2020-12-21 DIAGNOSIS — M25611 Stiffness of right shoulder, not elsewhere classified: Secondary | ICD-10-CM | POA: Insufficient documentation

## 2020-12-21 DIAGNOSIS — M6281 Muscle weakness (generalized): Secondary | ICD-10-CM | POA: Insufficient documentation

## 2020-12-21 DIAGNOSIS — R41841 Cognitive communication deficit: Secondary | ICD-10-CM | POA: Insufficient documentation

## 2020-12-21 DIAGNOSIS — M25511 Pain in right shoulder: Secondary | ICD-10-CM | POA: Diagnosis not present

## 2020-12-21 DIAGNOSIS — R600 Localized edema: Secondary | ICD-10-CM | POA: Diagnosis not present
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
## 2020-12-21 NOTE — Patient Instructions (Signed)
Access Code: [email or web address] URL: [email or web address] Date: 12/21/2020 Prepared by: Claude Manges  Program Notes Can use a heat pad on the shoulder before exercises (cover with towel, only leave on for no more than 15 minutes), ice after exercises.   Exercises Scaption Wall Slide with Towel - 1 x daily - 7 x weekly - 1 sets - 10 reps Standing Shoulder Internal Rotation Stretch with Towel - 1 x daily - 7 x weekly - 5-10 reps - 10" hold Standing Row with Anchored Resistance - 1 x daily - 7 x weekly - 2 sets - 10 reps Shoulder extension with resistance - Neutral - 1 x daily - 7 x weekly - 2 sets - 10 reps Corner Pec Major Stretch - 1 x daily - 7 x weekly - 4 sets - 20-30 seconds hold Ice - 1 x daily - 7 x weekly - 10-20 minutes hold
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
## 2020-12-29 ENCOUNTER — Encounter: Payer: Self-pay | Admitting: Physical Therapy

## 2020-12-29 ENCOUNTER — Ambulatory Visit: Payer: BC Managed Care – PPO | Admitting: Speech Pathology

## 2020-12-29 ENCOUNTER — Ambulatory Visit: Payer: BC Managed Care – PPO | Attending: Family Medicine | Admitting: Physical Therapy

## 2020-12-29 ENCOUNTER — Ambulatory Visit (INDEPENDENT_AMBULATORY_CARE_PROVIDER_SITE_OTHER): Payer: BC Managed Care – PPO | Admitting: Family Medicine

## 2020-12-29 ENCOUNTER — Other Ambulatory Visit: Payer: Self-pay

## 2020-12-29 ENCOUNTER — Encounter: Payer: Self-pay | Admitting: Family Medicine

## 2020-12-29 VITALS — BP 104/70 | HR 82 | Ht 63.0 in | Wt 156.0 lb

## 2020-12-29 DIAGNOSIS — M25611 Stiffness of right shoulder, not elsewhere classified: Secondary | ICD-10-CM | POA: Insufficient documentation

## 2020-12-29 DIAGNOSIS — F902 Attention-deficit hyperactivity disorder, combined type: Secondary | ICD-10-CM | POA: Diagnosis not present

## 2020-12-29 DIAGNOSIS — M6281 Muscle weakness (generalized): Secondary | ICD-10-CM | POA: Diagnosis not present

## 2020-12-29 DIAGNOSIS — R600 Localized edema: Secondary | ICD-10-CM | POA: Diagnosis not present

## 2020-12-29 DIAGNOSIS — M25511 Pain in right shoulder: Secondary | ICD-10-CM | POA: Insufficient documentation

## 2020-12-29 DIAGNOSIS — S060X0D Concussion without loss of consciousness, subsequent encounter: Secondary | ICD-10-CM

## 2020-12-29 MED ORDER — AMPHETAMINE-DEXTROAMPHET ER 20 MG PO CP24: 20.0000 mg | ORAL_CAPSULE | ORAL | 0 refills | Status: DC

## 2020-12-29 NOTE — Therapy (Signed)
[facility] [address] [place], [place], [address] Phone: [phone]   Fax:  [phone]  Physical Therapy Treatment  Patient Details  Name: Kathleen Carney MRN: [id number] Date of Birth: August 12, 1975 Referring Provider (PT): Denyse Amass   Encounter Date: 12/29/2020   PT End of Session - 12/29/20 1401     Visit Number 2    Date for PT Re-Evaluation 02/15/21    PT Start Time 1322   pt late   PT Stop Time 1400    PT Time Calculation (min) 38 min    Activity Tolerance Patient tolerated treatment well    Behavior During Therapy [facility] for tasks assessed/performed             History reviewed. No pertinent past medical history.  Past Surgical History:  Procedure Laterality Date   CHOLECYSTECTOMY N/A 08/20/2020   Procedure: LAPAROSCOPIC CHOLECYSTECTOMY;  Surgeon: Emelia Loron, MD;  Location: [facility] OR;  Service: General;  Laterality: N/A;    There were no vitals filed for this visit.   Subjective Assessment - 12/29/20 1325     Subjective Feeling an improvement- stretches have helped. Still having increased frequency of HAs- feels like it is related to sleeping too much/not enough. Denies dizziness, some photo/phonophobia, difficulty reading/computer work.    Pertinent History 3-4 months ago gallbladder removed    Patient Stated Goals less pain, back to exercises and activities, no pain with daily activities    Currently in Pain? No/denies                               [facility] Adult PT Treatment/Exercise - 12/29/20 0001       Exercises   Exercises Shoulder      Shoulder Exercises: Standing   External Rotation Strengthening;Right;10 reps;Theraband    External Rotation Limitations isometric stepouts    Internal Rotation Strengthening;Right;10 reps;Theraband    Internal Rotation Limitations isometric stepouts    Extension Strengthening;10 reps;Theraband    Theraband Level (Shoulder Extension) Level 3 (Green)     Extension Limitations 2x10    Row Strengthening;10 reps;Theraband    Theraband Level (Shoulder Row) --   orange   Row Limitations 2x10   cues to avoid extending past neutral   Other Standing Exercises B Y lift offs 10x; serratus punch at wall 10x      Shoulder Exercises: ROM/Strengthening   UBE (Upper Arm Bike) L2.0 x3 min fwd/back      Shoulder Exercises: Stretch   Other Shoulder Stretches R pec stretch in doorway 2x30"    Other Shoulder Stretches R shoulder IR stretch with strap 5x5" to tolerance                  Upper Extremity Functional Index Score :   /80   PT Education - 12/29/20 1401     Education Details update to HEP; administered red and green TB    Person(s) Educated Patient    Methods Explanation;Demonstration;Tactile cues;Verbal cues;Handout    Comprehension Verbalized understanding;Returned demonstration              PT Short Term Goals - 12/29/20 1407       PT SHORT TERM GOAL #1   Title Independent with initial HEP    Time 2    Period Weeks    Status Achieved    Target Date 01/04/21  PT Long Term Goals - 12/29/20 1407       PT LONG TERM GOAL #1   Title FOTO improved to at least 69% for R shoulder function    Time 8    Period Weeks    Status On-going      PT LONG TERM GOAL #2   Title Independent with advanced HEP and return to gym program    Time 8    Period Weeks    Status On-going      PT LONG TERM GOAL #3   Title Pt will report improved ease of reaching out to side, carrying, lifting weighted objects with RUE with </= 1/10 R shoulder pain    Time 8    Period Weeks    Status On-going      PT LONG TERM GOAL #4   Title R shoulder strength improved to at least 4+/5 with </= 1/10 pain    Time 8    Period Weeks    Status On-going      PT LONG TERM GOAL #5   Title R shoulder ROM symmetrical to left shoulder for all motions with </= 1/10 pain    Time 8    Period Weeks    Status On-going                    Plan - 12/29/20 1402     Clinical Impression Statement Patient arrived to session without new complaints and reports improvement in L shoulder but continued HAs. Denies dizziness, difficulty reading/computer work, but notes some photo/phonophobia. Patient performed progressive periscapular strengthening with minor corrective cueing required. Patient reported good challenge and muscle fatigue with isometric RTC strengthening but no pain. Still demonstrated some limitation in IR ROM today. Overall, patient tolerated today's session well and without complaints. Reported understanding of HEP update and without complaints at end of session.    PT Treatment/Interventions ADLs/Self Care Home Management;Cryotherapy;Electrical Stimulation;Iontophoresis 4mg /ml Dexamethasone;Moist Heat;Functional mobility training;Therapeutic activities;Therapeutic exercise;Neuromuscular re-education;Patient/family education;Manual techniques;Taping;Vasopneumatic Device;Dry needling    PT Next Visit Plan Reassess and progress HEP as tolerated. shoulder ROM working towards decreased pain at end ranges, gentle shoulder strengthening, postural strengthening. manual and modalities as needed.    Consulted and Agree with Plan of Care Patient             Patient will benefit from skilled therapeutic intervention in order to improve the following deficits and impairments:  Impaired UE functional use, Pain, Impaired flexibility, Decreased mobility, Decreased strength, Increased edema  Visit Diagnosis: Acute pain of right shoulder  Stiffness of right shoulder, not elsewhere classified  Muscle weakness (generalized)  Localized edema     Problem List Patient Active Problem List   Diagnosis Date Noted   Acute gallstone pancreatitis 08/16/2020   Leukocytosis 08/16/2020   Acute cholecystitis 08/16/2020    08/18/2020, PT, DPT 12/29/20 2:08 PM   [facility]  [place] [address] [place], [place], [place] Phone: [phone]   Fax:  [phone]  Name: Kathleen Carney MRN: Heloise Beecham Date of Birth: 05/06/1975

## 2020-12-29 NOTE — Patient Instructions (Signed)
Thank you for coming in today.    Start the adderall daily.    Recheck in 3 weeks.   Let me know if you have a problem.   Continue to work on the exercises you were given in speech therapy.   Amphetamine; Dextroamphetamine Extended-Release Capsules What is this medication? AMPHETAMINE; DEXTROAMPHETAMINE (am FET a meen; dex troe am FET a meen) treats attention-deficit hyperactivity disorder (ADHD). It works by improving focus and reducing impulsive behavior. It belongs to a group of medications called stimulants. This medicine may be used for other purposes; ask your health care provider or pharmacist if you have questions. COMMON BRAND NAME(S): Adderall XR, Mydayis What should I tell my care team before I take this medication? They need to know if you have any of these conditions: Anxiety or panic attacks Circulation problems in fingers and toes (Raynaud's disease) Glaucoma Heart attack Heart disease High blood pressure History of alcohol or drug abuse or addiction Kidney disease Liver disease Mental health disease Previous suicide attempt by you or a family member Seizures Stroke Suicidal thoughts, plans, or attempt Thyroid disease Tourette's syndrome An unusual or allergic reaction to dextroamphetamine, other amphetamines, other medications, foods, dyes, or preservatives Pregnant or trying to get pregnant Breast-feeding How should I use this medication? Take this medication by mouth with water. Take it as directed on the prescription label at the same time every day. You can take it with or without food. If it upsets your stomach, take it with food. Do not cut, crush, or chew this medication. Swallow the capsules whole. You may open the capsule and put the contents in 1 teaspoon of applesauce. Swallow the medication and applesauce right away. Do not chew the medication or applesauce. Keep taking it unless your care team tells you to stop. A special MedGuide will be given to  you by the pharmacist with each prescription and refill. Be sure to read this information carefully each time. Talk to your care team about the use of this medication in children. While it may be prescribed for children as young as 6 years for selected conditions, precautions do apply. Overdosage: If you think you have taken too much of this medicine contact a poison control center or emergency room at once. NOTE: This medicine is only for you. Do not share this medicine with others. What if I miss a dose? If you miss a dose, take it as soon as you can in the morning, but do not take it later in the day because it can cause trouble sleeping. If it is almost time for your next dose, take only that dose. Do not take double or extra doses. What may interact with this medication? Do not take this medication with any of the following: Linezolid MAOIs like Carbex, Eldepryl, Marplan, Nardil, and Parnate Methylene blue (injected into a vein) Other stimulant medications for attention disorders, weight loss, or to stay awake This medication may also interact with the following: Acetazolamide Ammonium chloride Ascorbic acid Atomoxetine Certain medications for blood pressure, heart disease, irregular heart beat Certain medications for depression, anxiety, or psychotic disturbances Cold or allergy medications Lithium Methenamine Narcotic medicines for pain Quinidine Ritonavir Sodium bicarbonate St. John's Wort Tryptophan This list may not describe all possible interactions. Give your health care provider a list of all the medicines, herbs, non-prescription drugs, or dietary supplements you use. Also tell them if you smoke, drink alcohol, or use illegal drugs. Some items may interact with your medicine. What should I  watch for while using this medication? Visit your care team for regular checks on your progress. This prescription requires that you follow special procedures with your care team and  pharmacy. You will need to have a new written prescription from your care team every time you need a refill. This medication may affect your concentration, or hide signs of tiredness. Until you know how this medication affects you, do not drive, ride a bicycle, use machinery, or do anything that needs mental alertness. Alcohol should be avoided with some brands of this medication. Talk to your care team if you have questions. Tell your care team if this medication loses its effects, or if you feel you need to take more than the prescribed amount. Do not change the dosage without talking to your care team. Decreased appetite is a common side effect when starting this medication. Eating small, frequent meals or snacks can help. Talk to your care team if you continue to have poor eating habits. Height and weight growth of a child taking this medication will be monitored closely. Do not take this medication close to bedtime. It may prevent you from sleeping. Tell your care team right away if you notice unexplained wounds on your fingers and toes while taking this medication. You should also tell your care team if you experience numbness or pain, changes in the skin color, or sensitivity to temperature in your fingers or toes. What side effects may I notice from receiving this medication? Side effects that you should report to your care team as soon as possible: Allergic reactions-skin rash, itching, hives, swelling of the face, lips, tongue, or throat Raynaud's-cool, numb, or painful fingers or toes that may change color from pale, to blue, to red Heart rhythm changes-fast or irregular heartbeat, dizziness, feeling faint or lightheaded, chest pain, trouble breathing Increase in blood pressure Mood and behavior changes-anxiety, nervousness, confusion, hallucinations, irritability, hostility, thoughts of suicide or self-harm, worsening mood, feelings of depression Painful or prolonged  erections Seizures Stroke in adults-sudden numbness or weakness of the face, arm or leg, trouble speaking, confusion, trouble walking, loss of balance or coordination, dizziness, severe headache, change in vision Side effects that usually do not require medical attention (report to your care team if they continue or are bothersome): Blurry vision Headache Loss of appetite Nausea Trouble sleeping Weight loss This list may not describe all possible side effects. Call your doctor for medical advice about side effects. You may report side effects to FDA at 1-800-FDA-1088. Where should I keep my medication? Keep out of the reach of children and pets. This medication can be abused. Keep it in a safe place to protect it from theft. Do not share it with anyone. It is only for you. Selling or giving away this medication is dangerous and against the law. Store at room temperature between 15 and 30 degrees C (59 and 86 degrees F). Protect from light and moisture. Keep container tightly closed. Get rid of any unused medication after the expiration date. This medication may cause harm and death if it is taken by other adults, children, or pets. It is important to get rid of the medication as soon as you no longer need it or it is expired. You can do this in two ways: Take the medication to a medication take-back program. Check with your pharmacy or law enforcement to find a location. If you cannot return the medication, check the label or package insert to see if the medication should be thrown  out in the garbage or flushed down the toilet. If you are not sure, ask your care team. If it is safe to put it in the trash, take the medication out of the container. Mix the medication with cat litter, dirt, coffee grounds, or other unwanted substance. Seal the mixture in a bag or container. Put it in the trash. NOTE: This sheet is a summary. It may not cover all possible information. If you have questions about this  medicine, talk to your doctor, pharmacist, or health care provider.  2022 Elsevier/Gold Standard (2020-08-04 12:51:46)

## 2020-12-31 NOTE — Addendum Note (Signed)
Addended by: Dorena Bodo on: 12/31/2020 09:35 AM   Modules accepted: Orders

## 2021-01-05 ENCOUNTER — Other Ambulatory Visit: Payer: Self-pay

## 2021-01-05 ENCOUNTER — Encounter: Payer: Self-pay | Admitting: Physical Therapy

## 2021-01-05 ENCOUNTER — Ambulatory Visit: Payer: BC Managed Care – PPO | Admitting: Speech Pathology

## 2021-01-05 ENCOUNTER — Ambulatory Visit: Payer: BC Managed Care – PPO | Admitting: Physical Therapy

## 2021-01-05 DIAGNOSIS — R600 Localized edema: Secondary | ICD-10-CM | POA: Diagnosis not present

## 2021-01-05 DIAGNOSIS — M6281 Muscle weakness (generalized): Secondary | ICD-10-CM

## 2021-01-05 DIAGNOSIS — M25611 Stiffness of right shoulder, not elsewhere classified: Secondary | ICD-10-CM | POA: Diagnosis not present

## 2021-01-05 DIAGNOSIS — M25511 Pain in right shoulder: Secondary | ICD-10-CM

## 2021-01-05 NOTE — Therapy (Signed)
[facility] [place] [facility] [address] [place], [place], [address] Phone: [phone]   Fax:  [phone]  Physical Therapy Treatment  Patient Details  Name: Kathleen Carney MRN: [id number] Date of Birth: 1975-12-20 Referring Provider (PT): Cheron Every Date: 01/05/2021   PT End of Session - 01/05/21 1437     Visit Number 3    Date for PT Re-Evaluation 02/15/21    PT Start Time 1319    PT Stop Time 1359    PT Time Calculation (min) 40 min    Activity Tolerance Patient tolerated treatment well    Behavior During Therapy [facility] for tasks assessed/performed             History reviewed. No pertinent past medical history.  Past Surgical History:  Procedure Laterality Date   CHOLECYSTECTOMY N/A 08/20/2020   Procedure: LAPAROSCOPIC CHOLECYSTECTOMY;  Surgeon: Emelia Loron, MD;  Location: [facility] OR;  Service: General;  Laterality: N/A;    There were no vitals filed for this visit.   Subjective Assessment - 01/05/21 1331     Subjective Still with HA and tightness and pain in the upper trap and neck area.  Reports still struggles with any of her previous exercise routine    Currently in Pain? Yes    Pain Location Neck    Pain Orientation Right    Pain Descriptors / Indicators Sore;Spasm    Aggravating Factors  weight lifting, pushup                               [facility] Adult PT Treatment/Exercise - 01/05/21 0001       Shoulder Exercises: Standing   External Rotation Both;20 reps;Theraband    Theraband Level (Shoulder External Rotation) Level 1 (Yellow);Level 2 (Red)    Extension Strengthening;Theraband;20 reps    Theraband Level (Shoulder Extension) Level 3 (Green)    Other Standing Exercises B Y lift offs 10x; serratus punch at wall 10x      Shoulder Exercises: ROM/Strengthening   UBE (Upper Arm Bike) L2.0 x3 min fwd/back    Lat Pull 2 plate;20 reps    Cybex Press 1 plate;20 reps    Cybex Row 1.5 plate;20  reps      Manual Therapy   Manual Therapy Soft tissue mobilization;Passive ROM    Soft tissue mobilization right upper trap and into the neck area,    Passive ROM right shoulder and cervical ROM                       PT Short Term Goals - 12/29/20 1407       PT SHORT TERM GOAL #1   Title Independent with initial HEP    Time 2    Period Weeks    Status Achieved    Target Date 01/04/21               PT Long Term Goals - 01/05/21 1441       PT LONG TERM GOAL #2   Title Independent with advanced HEP and return to gym program    Status On-going      PT LONG TERM GOAL #3   Title Pt will report improved ease of reaching out to side, carrying, lifting weighted objects with RUE with </= 1/10 R shoulder pain    Status On-going  Plan - 01/05/21 1438     Clinical Impression Statement I added STM and some neural tension stretches today, she has some significant knots in the right upper trap area, some neural tension in the right UE.  Doing well with the exercises only had some pain with the eccnetric part of chest press    PT Next Visit Plan see how she responds    Consulted and Agree with Plan of Care Patient             Patient will benefit from skilled therapeutic intervention in order to improve the following deficits and impairments:  Impaired UE functional use, Pain, Impaired flexibility, Decreased mobility, Decreased strength, Increased edema  Visit Diagnosis: Acute pain of right shoulder  Stiffness of right shoulder, not elsewhere classified  Muscle weakness (generalized)  Localized edema     Problem List Patient Active Problem List   Diagnosis Date Noted   Acute gallstone pancreatitis 08/16/2020   Leukocytosis 08/16/2020   Acute cholecystitis 08/16/2020    Jearld Lesch, PT 01/05/2021, 2:42 PM  [facility] [place] [facility] [address] [place], [place],  [address] Phone: [phone]   Fax:  [phone]  Name: Shantale Holtmeyer MRN: [id number] Date of Birth: 1975/07/21

## 2021-01-12 ENCOUNTER — Other Ambulatory Visit: Payer: Self-pay

## 2021-01-12 ENCOUNTER — Ambulatory Visit: Payer: BC Managed Care – PPO | Admitting: Physical Therapy

## 2021-01-12 ENCOUNTER — Encounter: Payer: Self-pay | Admitting: Physical Therapy

## 2021-01-12 ENCOUNTER — Ambulatory Visit: Payer: BC Managed Care – PPO | Admitting: Speech Pathology

## 2021-01-12 DIAGNOSIS — M25511 Pain in right shoulder: Secondary | ICD-10-CM

## 2021-01-12 DIAGNOSIS — R600 Localized edema: Secondary | ICD-10-CM | POA: Diagnosis not present

## 2021-01-12 DIAGNOSIS — M25611 Stiffness of right shoulder, not elsewhere classified: Secondary | ICD-10-CM

## 2021-01-12 DIAGNOSIS — M6281 Muscle weakness (generalized): Secondary | ICD-10-CM

## 2021-01-12 NOTE — Therapy (Signed)
[address] [place], [place], [address] Phone: [phone]   Fax:  [phone]  Physical Therapy Treatment  Patient Details  Name: Kathleen Carney MRN: [id number] Date of Birth: [birth year] Referring Provider (PT): Georgina Snell   Encounter Date: 01/12/2021   PT End of Session - 01/12/21 1351     Visit Number 4    Date for PT Re-Evaluation 02/15/21    PT Start Time 0321    PT Stop Time 1401    PT Time Calculation (min) 49 min    Activity Tolerance Patient tolerated treatment well    Behavior During Therapy [facility] for tasks assessed/performed             History reviewed. No pertinent past medical history.  Past Surgical History:  Procedure Laterality Date   CHOLECYSTECTOMY N/A 08/20/2020   Procedure: LAPAROSCOPIC CHOLECYSTECTOMY;  Surgeon: Rolm Bookbinder, MD;  Location: [place];  Service: General;  Laterality: N/A;    There were no vitals filed for this visit.   Subjective Assessment - 01/12/21 1319     Subjective I think I am getting better, less pain and I am feeling stronger, still feel very tight    Currently in Pain? Yes    Pain Score 1     Pain Location Neck    Pain Orientation Right    Pain Descriptors / Indicators Sore;Tightness;Spasm    Pain Relieving Factors the treatment seems to be helping                               [facility] Adult PT Treatment/Exercise - 01/12/21 0001       Shoulder Exercises: Standing   External Rotation Both;20 reps;Theraband    Theraband Level (Shoulder External Rotation) Level 2 (Red)    Extension Strengthening;Both;20 reps;Weights    Extension Weight (lbs) 10    Other Standing Exercises 5# shrugs with upper trap and levator stretches    Other Standing Exercises B Y lift offs 10x; serratus punch at wall 10x      Shoulder Exercises: ROM/Strengthening   UBE (Upper Arm Bike) L3 x3 min fwd/back    Lat Pull 2 plate;20 reps    Cybex Press 1 plate;20 reps     Cybex Row 1.5 plate;20 reps      Manual Therapy   Manual Therapy Soft tissue mobilization;Passive ROM    Soft tissue mobilization right upper trap and into the neck area,    Passive ROM right shoulder and cervical ROM              Trigger Point Dry Needling - 01/12/21 0001     Consent Given? Yes    Education Handout Provided Yes    Muscles Treated Head and Neck Upper trapezius    Upper Trapezius Response Twitch reponse elicited;Palpable increased muscle length                     PT Short Term Goals - 12/29/20 1407       PT SHORT TERM GOAL #1   Title Independent with initial HEP    Time 2    Period Weeks    Status Achieved    Target Date 01/04/21               PT Long Term Goals - 01/12/21 1354       PT LONG TERM GOAL #1   Title FOTO improved to  at least 69% for R shoulder function    Status On-going      PT LONG TERM GOAL #2   Title Independent with advanced HEP and return to gym program    Status On-going      PT LONG TERM GOAL #3   Title Pt will report improved ease of reaching out to side, carrying, lifting weighted objects with RUE with </= 1/10 R shoulder pain    Status Partially Met                   Plan - 01/12/21 1352     Clinical Impression Statement Added DN today, great LTR's.  Much more movements, still significant knots and tightness in the pectoral and anterior shoulder, including scalenes    PT Next Visit Plan continue to progress as tolerated    Consulted and Agree with Plan of Care Patient             Patient will benefit from skilled therapeutic intervention in order to improve the following deficits and impairments:  Impaired UE functional use, Pain, Impaired flexibility, Decreased mobility, Decreased strength, Increased edema  Visit Diagnosis: Acute pain of right shoulder  Stiffness of right shoulder, not elsewhere classified  Muscle weakness (generalized)     Problem List Patient Active  Problem List   Diagnosis Date Noted   Acute gallstone pancreatitis 08/16/2020   Leukocytosis 08/16/2020   Acute cholecystitis 08/16/2020    Sumner Boast, PT 01/12/2021, 1:55 PM  [address] [place], [place], [address] Phone: [phone]   Fax:  [phone]  Name: Kathleen Carney MRN: [id number] Date of Birth: 1975-07-29

## 2021-01-19 ENCOUNTER — Ambulatory Visit: Payer: BC Managed Care – PPO | Admitting: Speech Pathology

## 2021-01-19 ENCOUNTER — Ambulatory Visit: Payer: BC Managed Care – PPO | Admitting: Physical Therapy

## 2021-01-19 ENCOUNTER — Ambulatory Visit (INDEPENDENT_AMBULATORY_CARE_PROVIDER_SITE_OTHER): Payer: BC Managed Care – PPO | Admitting: Family Medicine

## 2021-01-19 ENCOUNTER — Encounter: Payer: Self-pay | Admitting: Physical Therapy

## 2021-01-19 ENCOUNTER — Other Ambulatory Visit: Payer: Self-pay

## 2021-01-19 VITALS — BP 108/74 | HR 68 | Ht 63.0 in | Wt 159.8 lb

## 2021-01-19 DIAGNOSIS — S060X0D Concussion without loss of consciousness, subsequent encounter: Secondary | ICD-10-CM | POA: Diagnosis not present

## 2021-01-19 DIAGNOSIS — R600 Localized edema: Secondary | ICD-10-CM | POA: Diagnosis not present

## 2021-01-19 DIAGNOSIS — M25511 Pain in right shoulder: Secondary | ICD-10-CM | POA: Diagnosis not present

## 2021-01-19 DIAGNOSIS — F902 Attention-deficit hyperactivity disorder, combined type: Secondary | ICD-10-CM | POA: Diagnosis not present

## 2021-01-19 DIAGNOSIS — M6281 Muscle weakness (generalized): Secondary | ICD-10-CM | POA: Diagnosis not present

## 2021-01-19 DIAGNOSIS — M25611 Stiffness of right shoulder, not elsewhere classified: Secondary | ICD-10-CM | POA: Diagnosis not present

## 2021-01-19 MED ORDER — AMPHETAMINE-DEXTROAMPHETAMINE 10 MG PO TABS: 10.0000 mg | ORAL_TABLET | Freq: Two times a day (BID) | ORAL | 0 refills | Status: DC

## 2021-01-19 NOTE — Patient Instructions (Addendum)
Thank you for coming in today.   I've referred you to [facility], you should hear about scheduling soon.  Stop the extended release Adderall and start the short acting Adderall.  Let me know how the change in medication does for you  Recheck back in 1 month or as needed.

## 2021-01-19 NOTE — Therapy (Signed)
[facility]. [place], [place], [address] Phone: [phone]   Fax:  [phone]  Physical Therapy Treatment  Patient Details  Name: Kathleen Carney MRN: [id number] Date of Birth: 14-Oct-1975 Referring Provider (PT): Georgina Snell   Encounter Date: 01/19/2021   PT End of Session - 01/19/21 1355     Visit Number 5    Date for PT Re-Evaluation 02/15/21    PT Start Time 5038    PT Stop Time 1358    PT Time Calculation (min) 40 min    Activity Tolerance Patient tolerated treatment well    Behavior During Therapy [facility] for tasks assessed/performed             History reviewed. No pertinent past medical history.  Past Surgical History:  Procedure Laterality Date   CHOLECYSTECTOMY N/A 08/20/2020   Procedure: LAPAROSCOPIC CHOLECYSTECTOMY;  Surgeon: Rolm Bookbinder, MD;  Location: [place];  Service: General;  Laterality: N/A;    There were no vitals filed for this visit.   Subjective Assessment - 01/19/21 1320     Subjective Reports that she was able to do 1 pushup with just a little bit of pain.    Pertinent History 3-4 months ago gallbladder removed    Patient Stated Goals less pain, back to exercises and activities, no pain with daily activities    Currently in Pain? No/denies    Pain Location Shoulder                               [facility] Adult PT Treatment/Exercise - 01/19/21 0001       Shoulder Exercises: Prone   Other Prone Exercises bird dog 10x; bird dog + 5# row; high plank on knees + alt shoulder tap 2x10   cues to slow down and avoud lumbar lordosis   Other Prone Exercises R prone I T Y over pball 10x 0#, 10x 2#   cues to maintain elbow straight     Shoulder Exercises: Standing   Horizontal ABduction Strengthening;Both;10 reps    Theraband Level (Shoulder Horizontal ABduction) Level 2 (Red)    Horizontal ABduction Limitations standing against doorframe 2x10    External Rotation  Both;Theraband;10 reps    Theraband Level (Shoulder External Rotation) Level 3 (Green)    External Rotation Limitations shoulder in neutral; cues for control    Internal Rotation Strengthening;Right;10 reps;Theraband    Theraband Level (Shoulder Internal Rotation) Level 3 (Green)    Internal Rotation Limitations shoulder in neutral; cues for control    Other Standing Exercises wall push up + punch x10; modified pushup + plus x10   good form     Shoulder Exercises: ROM/Strengthening   UBE (Upper Arm Bike) L3 x3 min fwd/back                     PT Education - 01/19/21 1355     Education Details update to HEP    Person(s) Educated Patient    Methods Explanation;Demonstration;Tactile cues;Verbal cues;Handout    Comprehension Verbalized understanding;Returned demonstration              PT Short Term Goals - 12/29/20 1407       PT SHORT TERM GOAL #1   Title Independent with initial HEP    Time 2    Period Weeks    Status Achieved    Target Date 01/04/21  PT Long Term Goals - 01/12/21 1354       PT LONG TERM GOAL #1   Title FOTO improved to at least 69% for R shoulder function    Status On-going      PT LONG TERM GOAL #2   Title Independent with advanced HEP and return to gym program    Status On-going      PT LONG TERM GOAL #3   Title Pt will report improved ease of reaching out to side, carrying, lifting weighted objects with RUE with </= 1/10 R shoulder pain    Status Partially Met                   Plan - 01/19/21 1358     Clinical Impression Statement Patient arrived to session with report that she was able to do 1 pushup with just a little bit of pain over the weekend. Proceeded with periscapular strengthening with patient tolerating this well and demonstrating good ROM. Scapular stability exercises in plank and quadruped with patient requiring slight modification d/t UE fatigue. Encouraged patient to continue with modified  exercises at home to tolerance. Patient reported understanding of HEP update. Good form with wall and modified pushups was demonstrated today. Increased resistance with shoulder rotation strengthening- cuing for control required but tolerated this well. No complaints at end of session.    PT Treatment/Interventions ADLs/Self Care Home Management;Cryotherapy;Electrical Stimulation;Iontophoresis 32m/ml Dexamethasone;Moist Heat;Functional mobility training;Therapeutic activities;Therapeutic exercise;Neuromuscular re-education;Patient/family education;Manual techniques;Taping;Vasopneumatic Device;Dry needling    PT Next Visit Plan continue to progress as tolerated    Consulted and Agree with Plan of Care Patient             Patient will benefit from skilled therapeutic intervention in order to improve the following deficits and impairments:  Impaired UE functional use, Pain, Impaired flexibility, Decreased mobility, Decreased strength, Increased edema  Visit Diagnosis: Acute pain of right shoulder  Stiffness of right shoulder, not elsewhere classified  Muscle weakness (generalized)  Localized edema     Problem List Patient Active Problem List   Diagnosis Date Noted   Acute gallstone pancreatitis 08/16/2020   Leukocytosis 08/16/2020   Acute cholecystitis 08/16/2020    YJanene Harvey PT, DPT 01/19/21 2:01 PM   [place] [address]Phone: [phone]  Fax:  [phone] Name: RNathifa RitthalerMRN: [id number] of Birth: 110/19/1977
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
## 2021-01-26 ENCOUNTER — Encounter: Payer: Self-pay | Admitting: Physical Therapy

## 2021-01-26 ENCOUNTER — Other Ambulatory Visit: Payer: Self-pay

## 2021-01-26 ENCOUNTER — Ambulatory Visit: Payer: BC Managed Care – PPO | Attending: Family Medicine | Admitting: Physical Therapy

## 2021-01-26 DIAGNOSIS — M25611 Stiffness of right shoulder, not elsewhere classified: Secondary | ICD-10-CM | POA: Insufficient documentation

## 2021-01-26 DIAGNOSIS — M25511 Pain in right shoulder: Secondary | ICD-10-CM | POA: Diagnosis not present

## 2021-01-26 DIAGNOSIS — R600 Localized edema: Secondary | ICD-10-CM | POA: Diagnosis not present

## 2021-01-26 DIAGNOSIS — M6281 Muscle weakness (generalized): Secondary | ICD-10-CM | POA: Insufficient documentation

## 2021-01-26 DIAGNOSIS — R41841 Cognitive communication deficit: Secondary | ICD-10-CM | POA: Diagnosis not present

## 2021-01-26 NOTE — Therapy (Signed)
[facility]. [place], [place], [address] Phone: [phone]   Fax:  [phone]  Physical Therapy Treatment  Patient Details  Name: Kathleen Carney MRN: [id number] Date of Birth: 1975/12/18 Referring Provider (PT): Marikay Alar Date: 01/26/2021   PT End of Session - 01/26/21 1338     Visit Number 6    Date for PT Re-Evaluation 02/15/21    PT Start Time 1302    PT Stop Time 1343    PT Time Calculation (min) 41 min    Activity Tolerance Patient tolerated treatment well    Behavior During Therapy [facility] for tasks assessed/performed             History reviewed. No pertinent past medical history.  Past Surgical History:  Procedure Laterality Date   CHOLECYSTECTOMY N/A 08/20/2020   Procedure: LAPAROSCOPIC CHOLECYSTECTOMY;  Surgeon: Rolm Bookbinder, MD;  Location: [facility];  Service: General;  Laterality: N/A;    There were no vitals filed for this visit.   Subjective Assessment - 01/26/21 1303     Subjective "it has been better"    Pertinent History 3-4 months ago gallbladder removed    Currently in Pain? No/denies                [facility] PT Assessment - 01/26/21 0001       AROM   Overall AROM  Within functional limits for tasks performed                           [facility] Adult PT Treatment/Exercise - 01/26/21 0001       Shoulder Exercises: Standing   Horizontal ABduction Strengthening;Both;10 reps    Theraband Level (Shoulder Horizontal ABduction) Level 2 (Red)    External Rotation Both;Theraband;20 reps    Theraband Level (Shoulder External Rotation) Level 2 (Red)    Internal Rotation Strengthening;Right;Theraband;20 reps    Theraband Level (Shoulder Internal Rotation) Level 3 (Green)    Flexion Strengthening;Both;20 reps;Weights    Shoulder Flexion Weight (lbs) 3    ABduction Strengthening;Both;20 reps;Weights    Shoulder ABduction Weight (lbs) 2    Extension  Strengthening;Both;15 reps;Theraband;Other (comment)   x2   Theraband Level (Shoulder Extension) Level 3 (Green)    Other Standing Exercises Tricep Ext 20lb 3x10    Other Standing Exercises wall push ups 2x10      Shoulder Exercises: ROM/Strengthening   UBE (Upper Arm Bike) L3 x3 min fwd/back    Lat Pull 20 reps   25lb   Cybex Press 20 reps   5lb   Cybex Row 20 reps   25lb                      PT Short Term Goals - 12/29/20 1407       PT SHORT TERM GOAL #1   Title Independent with initial HEP    Time 2    Period Weeks    Status Achieved    Target Date 01/04/21               PT Long Term Goals - 01/26/21 1338       PT LONG TERM GOAL #1   Title FOTO improved to at least 69% for R shoulder function    Status On-going      PT LONG TERM GOAL #2   Title Independent with advanced HEP and return to gym program  Status On-going      PT LONG TERM GOAL #3   Title Pt will report improved ease of reaching out to side, carrying, lifting weighted objects with RUE with </= 1/10 R shoulder pain    Status Partially Met                   Plan - 01/26/21 1339     Clinical Impression Statement Upon entering clinic pt reports some R shoulder improvement. Pt has progressed increasing her R shoulder AROM in all directions. Pt did well completing all interventions with goo strength and ROM. Some RUE fatigue reported with internal and external rotation. Pt able to complete standing shoulder flexion and abduction without shoulder elevation. No reports post session.    Stability/Clinical Decision Making Stable/Uncomplicated    Rehab Potential Good    PT Frequency 1x / week    PT Duration 8 weeks    PT Treatment/Interventions ADLs/Self Care Home Management;Cryotherapy;Electrical Stimulation;Iontophoresis 34m/ml Dexamethasone;Moist Heat;Functional mobility training;Therapeutic activities;Therapeutic exercise;Neuromuscular re-education;Patient/family education;Manual  techniques;Taping;Vasopneumatic Device;Dry needling    PT Next Visit Plan continue to progress as tolerated             Patient will benefit from skilled therapeutic intervention in order to improve the following deficits and impairments:  Impaired UE functional use, Pain, Impaired flexibility, Decreased mobility, Decreased strength, Increased edema  Visit Diagnosis: Muscle weakness (generalized)  Localized edema  Stiffness of right shoulder, not elsewhere classified  Acute pain of right shoulder     Problem List Patient Active Problem List   Diagnosis Date Noted   Acute gallstone pancreatitis 08/16/2020   Leukocytosis 08/16/2020   Acute cholecystitis 08/16/2020    Kathleen Carney PTA 01/26/2021, 1:44 PM  [place] [address]Phone: [phone]  Fax:  [phone] Name: Kathleen FinfrockMRN: [id number] of Birth: [birth year]

## 2021-02-02 ENCOUNTER — Encounter: Payer: Self-pay | Admitting: Physical Therapy

## 2021-02-02 ENCOUNTER — Ambulatory Visit: Payer: BC Managed Care – PPO | Admitting: Physical Therapy

## 2021-02-02 ENCOUNTER — Other Ambulatory Visit: Payer: Self-pay

## 2021-02-02 DIAGNOSIS — M6281 Muscle weakness (generalized): Secondary | ICD-10-CM | POA: Diagnosis not present

## 2021-02-02 DIAGNOSIS — M25611 Stiffness of right shoulder, not elsewhere classified: Secondary | ICD-10-CM

## 2021-02-02 DIAGNOSIS — R600 Localized edema: Secondary | ICD-10-CM | POA: Diagnosis not present

## 2021-02-02 DIAGNOSIS — R41841 Cognitive communication deficit: Secondary | ICD-10-CM | POA: Diagnosis not present

## 2021-02-02 DIAGNOSIS — M25511 Pain in right shoulder: Secondary | ICD-10-CM

## 2021-02-02 NOTE — Therapy (Signed)
[facility] [place] [facility] [address] [place], [place], [address] Phone: [phone]   Fax:  [phone]  Physical Therapy Treatment  Patient Details  Name: Kathleen Carney MRN: [id number] Date of Birth: 1976-02-10 Referring Provider (PT): Cheron Every Date: 02/02/2021   PT End of Session - 02/02/21 1305     Visit Number 7    Date for PT Re-Evaluation 02/15/21    PT Start Time 1231    PT Stop Time 1316    PT Time Calculation (min) 45 min    Activity Tolerance Patient tolerated treatment well    Behavior During Therapy [facility] for tasks assessed/performed             History reviewed. No pertinent past medical history.  Past Surgical History:  Procedure Laterality Date   CHOLECYSTECTOMY N/A 08/20/2020   Procedure: LAPAROSCOPIC CHOLECYSTECTOMY;  Surgeon: Emelia Loron, MD;  Location: [facility] OR;  Service: General;  Laterality: N/A;    There were no vitals filed for this visit.   Subjective Assessment - 02/02/21 1231     Subjective R shoulder feeling better. Woke up with L shoulder pain, possibly slept on it wrong.    Pertinent History 3-4 months ago gallbladder removed    Patient Stated Goals less pain, back to exercises and activities, no pain with daily activities    Currently in Pain? Yes    Pain Location Shoulder    Pain Orientation Left    Pain Descriptors / Indicators Aching    Pain Type Acute pain    Pain Radiating Towards woke up with sore L shoiulder    Pain Onset Today    Pain Frequency Intermittent                               [facility] Adult PT Treatment/Exercise - 02/02/21 1236       Shoulder Exercises: Prone   Other Prone Exercises R prone I T Y over pball 2 x 10x 2#      Shoulder Exercises: ROM/Strengthening   UBE (Upper Arm Bike) L3 x2:30 min fwd/back    Lat Pull 20 reps    Lat Pull Limitations 10#    Cybex Press 20 reps    Cybex Press Limitations 5#    Cybex Row 20 reps    Cybex  Row Limitations 5#    Plank 30 seconds    Modified Plank Limitations    Modified Plank Limitations Prone on knees, U row with 2#, 10 reps each side.    Side Plank Other (comment)   2 x 10 seconds on each side                    PT Education - 02/02/21 1316     Person(s) Educated Patient    Methods Explanation;Demonstration    Comprehension Verbalized understanding;Returned demonstration              PT Short Term Goals - 02/02/21 1320       PT SHORT TERM GOAL #2   Title Patient will be I iwht updated HEP    Baseline Plan to update next week.    Time 2    Period Weeks    Status New    Target Date 02/16/21               PT Long Term Goals - 02/02/21 1320       PT  LONG TERM GOAL #1   Title FOTO improved to at least 69% for R shoulder function    Time 8    Period Weeks    Status On-going      PT LONG TERM GOAL #2   Title Independent with advanced HEP and return to gym program    Status On-going      PT LONG TERM GOAL #3   Title Pt will report improved ease of reaching out to side, carrying, lifting weighted objects with RUE with </= 1/10 R shoulder pain    Status On-going      PT LONG TERM GOAL #4   Title R shoulder strength improved to at least 4+/5 with </= 1/10 pain    Status On-going      PT LONG TERM GOAL #5   Title R shoulder ROM symmetrical to left shoulder for all motions with </= 1/10 pain    Status On-going                   Plan - 02/02/21 1317     Clinical Impression Statement Patient reports she woke up with some soreness and stiffness in Lshoulder this morning, but was able to complete all treatment activities with either UE. Introduced side planks for further engagement of scapular stabilizers and trunk, able to hold 2 x 10 seconds on each side.    Stability/Clinical Decision Making Stable/Uncomplicated    Rehab Potential Good    PT Frequency 1x / week    PT Duration 8 weeks    PT Treatment/Interventions ADLs/Self  Care Home Management;Cryotherapy;Electrical Stimulation;Iontophoresis 4mg /ml Dexamethasone;Moist Heat;Functional mobility training;Therapeutic activities;Therapeutic exercise;Neuromuscular re-education;Patient/family education;Manual techniques;Taping;Vasopneumatic Device;Dry needling    PT Next Visit Plan Review and update HEP as appropriate.    PT Home Exercise Plan No changes today             Patient will benefit from skilled therapeutic intervention in order to improve the following deficits and impairments:  Impaired UE functional use, Pain, Impaired flexibility, Decreased mobility, Decreased strength, Increased edema  Visit Diagnosis: Muscle weakness (generalized)  Localized edema  Stiffness of right shoulder, not elsewhere classified  Acute pain of right shoulder     Problem List Patient Active Problem List   Diagnosis Date Noted   Acute gallstone pancreatitis 08/16/2020   Leukocytosis 08/16/2020   Acute cholecystitis 08/16/2020    08/18/2020, DPT 02/02/2021, 1:23 PM  [facility] [place] [facility] [address] [place], [place], [place] Phone: [phone]   Fax:  [phone]  Name: Missi Mcmackin MRN: Heloise Beecham Date of Birth: 1976-01-03

## 2021-02-09 ENCOUNTER — Ambulatory Visit: Payer: BC Managed Care – PPO | Admitting: Physical Therapy

## 2021-02-09 ENCOUNTER — Encounter: Payer: Self-pay | Admitting: Physical Therapy

## 2021-02-09 ENCOUNTER — Other Ambulatory Visit: Payer: Self-pay

## 2021-02-09 DIAGNOSIS — R600 Localized edema: Secondary | ICD-10-CM | POA: Diagnosis not present

## 2021-02-09 DIAGNOSIS — M25611 Stiffness of right shoulder, not elsewhere classified: Secondary | ICD-10-CM

## 2021-02-09 DIAGNOSIS — M6281 Muscle weakness (generalized): Secondary | ICD-10-CM | POA: Diagnosis not present

## 2021-02-09 DIAGNOSIS — M25511 Pain in right shoulder: Secondary | ICD-10-CM

## 2021-02-09 DIAGNOSIS — R41841 Cognitive communication deficit: Secondary | ICD-10-CM | POA: Diagnosis not present

## 2021-02-09 NOTE — Patient Instructions (Signed)
  Access Code: [email or web address] URL: [email or web address] Date: 02/09/2021 Prepared by: Oley Balm  Program Notes Hold x 10 seconds. Build to 30 seconds as tolerated.   Exercises Side Plank on Elbow - 1 x daily - 7 x weekly - 1 sets - 4 reps Reverse Plank on Elbows - 1 x daily - 7 x weekly - 1 sets - 4 reps Standard Plank - 1 x daily - 7 x weekly - 1 sets - 4 reps

## 2021-02-09 NOTE — Therapy (Signed)
[facility] [place] [address] [place], [place], [address] Phone: [phone]   Fax:  [phone]  Physical Therapy Treatment  Patient Details  Name: Kathleen Carney MRN: [id number] Date of Birth: 1975/04/27 Referring Provider (PT): Cheron Every Date: 02/09/2021   PT End of Session - 02/09/21 1315     Visit Number 8    Date for PT Re-Evaluation 02/15/21    PT Start Time 1235    PT Stop Time 1320    PT Time Calculation (min) 45 min    Activity Tolerance Patient tolerated treatment well    Behavior During Therapy [facility] for tasks assessed/performed             History reviewed. No pertinent past medical history.  Past Surgical History:  Procedure Laterality Date   CHOLECYSTECTOMY N/A 08/20/2020   Procedure: LAPAROSCOPIC CHOLECYSTECTOMY;  Surgeon: Emelia Loron, MD;  Location: [facility] OR;  Service: General;  Laterality: N/A;    There were no vitals filed for this visit.   Subjective Assessment - 02/09/21 1243     Subjective Patient reports both shoulders feel better. R shoulder continues to have less pain, but still present.    Pertinent History 3-4 months ago gallbladder removed    Patient Stated Goals less pain, back to exercises and activities, no pain with daily activities    Currently in Pain? No/denies                               [facility] Adult PT Treatment/Exercise - 02/09/21 0001       Shoulder Exercises: Standing   External Rotation Both;Theraband;20 reps    Theraband Level (Shoulder External Rotation) Level 4 (Blue)    Internal Rotation Both;Strengthening;20 reps    Theraband Level (Shoulder Internal Rotation) Level 4 (Blue)    Extension Strengthening;Both;20 reps    Theraband Level (Shoulder Extension) Level 4 (Blue)      Shoulder Exercises: ROM/Strengthening   Lat Pull 20 reps    Lat Pull Limitations 25#    Cybex Press 20 reps    Cybex Press Limitations 10#    Cybex Row 20 reps     Cybex Row Limitations 10#                     PT Education - 02/09/21 1303     Education Details Updated HEP, educated to maintain postural control during all exercises.    Person(s) Educated Patient    Methods Explanation;Demonstration;Handout    Comprehension Verbalized understanding;Returned demonstration              PT Short Term Goals - 02/09/21 1242       PT SHORT TERM GOAL #2   Title Patient will be I iwht updated HEP    Baseline HEP updated.    Time 1    Period Weeks    Status On-going    Target Date 02/16/21               PT Long Term Goals - 02/09/21 1243       PT LONG TERM GOAL #2   Title Independent with advanced HEP and return to gym program    Time 6    Period Weeks    Status On-going                   Plan - 02/09/21 1304     Clinical  Impression Statement Reports improved pain in B shoulders, increased resistance for all shoulder strenghtening exercises.    Stability/Clinical Decision Making Stable/Uncomplicated    Clinical Decision Making Low    Rehab Potential Good    PT Frequency 1x / week    PT Next Visit Plan Review and update HEP as appropriate. RE-assess for possible D/C.    PT Home Exercise Plan see instructoins    Consulted and Agree with Plan of Care Patient             Patient will benefit from skilled therapeutic intervention in order to improve the following deficits and impairments:  Impaired UE functional use, Pain, Impaired flexibility, Decreased mobility, Decreased strength, Increased edema  Visit Diagnosis: Muscle weakness (generalized)  Localized edema  Stiffness of right shoulder, not elsewhere classified  Acute pain of right shoulder     Problem List Patient Active Problem List   Diagnosis Date Noted   Acute gallstone pancreatitis 08/16/2020   Leukocytosis 08/16/2020   Acute cholecystitis 08/16/2020    Iona Beard, DPT 02/09/2021, 1:23 PM  [facility] [place] [address]. [place], [place], [address] Phone: [phone]   Fax:  [phone]  Name: Kathleen Carney MRN: [id number] Date of Birth: 1975/08/18

## 2021-02-15 ENCOUNTER — Encounter: Payer: Self-pay | Admitting: Physical Therapy

## 2021-02-15 ENCOUNTER — Other Ambulatory Visit: Payer: Self-pay

## 2021-02-15 ENCOUNTER — Ambulatory Visit: Payer: BC Managed Care – PPO | Admitting: Physical Therapy

## 2021-02-15 DIAGNOSIS — R41841 Cognitive communication deficit: Secondary | ICD-10-CM

## 2021-02-15 DIAGNOSIS — M6281 Muscle weakness (generalized): Secondary | ICD-10-CM

## 2021-02-15 DIAGNOSIS — R600 Localized edema: Secondary | ICD-10-CM

## 2021-02-15 DIAGNOSIS — M25611 Stiffness of right shoulder, not elsewhere classified: Secondary | ICD-10-CM | POA: Diagnosis not present

## 2021-02-15 DIAGNOSIS — M25511 Pain in right shoulder: Secondary | ICD-10-CM | POA: Diagnosis not present
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
## 2021-02-15 NOTE — Therapy (Signed)
[address] [place], [place], [address] Phone: [phone]   Fax:  [phone]  February 15, 2021   No Recipients  Physical Therapy Discharge Summary  Patient: Kathleen Carney  MRN: [id number]  Date of Birth: 13-Jul-1975   Diagnosis: Muscle weakness (generalized)  Localized edema  Stiffness of right shoulder, not elsewhere classified  Acute pain of right shoulder  Cognitive communication deficit Referring Provider (PT): Georgina Snell   The above patient had been seen in Physical Therapy 9 times of 9 treatments scheduled with 0 no shows and 0 cancellations.  The treatment consisted of strength, ROM, pain management The patient is: Improved  Subjective: Made excellent progress in all areas.  Discharge Findings: See objective findings  Functional Status at Discharge: I, initiating I strengthening program with HEP to get started.  Goals Partially Met   Plan - 02/15/21 1321     Clinical Impression Statement Patient has prgressed well to meet LTG. D/C PT services.    Stability/Clinical Decision Making Stable/Uncomplicated    Clinical Decision Making Low    PT Treatment/Interventions ADLs/Self Care Home Management;Cryotherapy;Electrical Stimulation;Iontophoresis 69m/ml Dexamethasone;Moist Heat;Functional mobility training;Therapeutic activities;Therapeutic exercise;Neuromuscular re-education;Patient/family education;Manual techniques;Taping;Vasopneumatic Device;Dry needling    PT Next Visit Plan D/C PT    PT Home Exercise Plan [email or web address]    Consulted and Agree with Plan of Care Patient             Sincerely,   SMarcelina Morel DPT   CC No Recipients  [place] [address]Phone: [phone]  Fax:  [phone] Patient: Kathleen Carney MRN: [id number] Date of Birth: 102/18/1977

## 2021-02-15 NOTE — Patient Instructions (Signed)
Access Code: [email or web address] URL: [email or web address] Date: 02/15/2021 Prepared by: Oley Balm  Exercises Standing Shoulder Internal Rotation Stretch with Towel - 1 x daily - 7 x weekly - 1 sets - 13 reps - 20 hold Shoulder External Rotation with Anchored Resistance - 1 x daily - 7 x weekly - 2 sets - 10 reps Standing Shoulder Row with Anchored Resistance - 1 x daily - 7 x weekly - 2 sets - 10 reps Shoulder extension with resistance - Neutral - 1 x daily - 7 x weekly - 2 sets - 10 reps Standing Row with Anchored Resistance - 1 x daily - 7 x weekly - 2 sets - 10 reps Doorway Pec Stretch at 90 Degrees Abduction - 1 x daily - 7 x weekly - 1 sets - 3 reps - 20 hold Standard Plank - 1 x daily - 7 x weekly - 1 sets - 3-4 reps - 30 hold Side Plank on Elbow - 1 x daily - 7 x weekly - 1 sets - 3-4 reps - 30 hold Supine Plank - 1 x daily - 7 x weekly - 3-4 reps - 30 hold Full Plank with Shoulder Taps - 1 x daily - 7 x weekly - 1 sets - 10 reps

## 2021-02-16 ENCOUNTER — Encounter: Payer: Self-pay | Admitting: Family Medicine

## 2021-02-16 ENCOUNTER — Ambulatory Visit (INDEPENDENT_AMBULATORY_CARE_PROVIDER_SITE_OTHER): Payer: BC Managed Care – PPO | Admitting: Family Medicine

## 2021-02-16 VITALS — BP 104/82 | HR 76 | Ht 63.0 in | Wt 167.0 lb

## 2021-02-16 DIAGNOSIS — S060X0D Concussion without loss of consciousness, subsequent encounter: Secondary | ICD-10-CM | POA: Diagnosis not present

## 2021-02-16 DIAGNOSIS — F902 Attention-deficit hyperactivity disorder, combined type: Secondary | ICD-10-CM

## 2021-02-16 MED ORDER — AMPHETAMINE-DEXTROAMPHETAMINE 10 MG PO TABS: 10.0000 mg | ORAL_TABLET | Freq: Every day | ORAL | 0 refills | Status: DC

## 2021-02-16 NOTE — Patient Instructions (Addendum)
Good to see you today.  Follow-up: as needed.   Let me know when you need a refill of Adderall.   Get set up with [facility]   Finish up PT.

## 2021-02-23 ENCOUNTER — Ambulatory Visit: Payer: BC Managed Care – PPO | Admitting: Physical Therapy

## 2021-03-02 ENCOUNTER — Ambulatory Visit: Payer: BC Managed Care – PPO | Admitting: Physical Therapy

## 2021-03-09 ENCOUNTER — Ambulatory Visit: Payer: BC Managed Care – PPO | Admitting: Physical Therapy

## 2021-03-16 ENCOUNTER — Ambulatory Visit: Payer: BC Managed Care – PPO | Admitting: Physical Therapy

## 2021-03-23 ENCOUNTER — Ambulatory Visit: Payer: BC Managed Care – PPO | Admitting: Physical Therapy

## 2021-04-15 DIAGNOSIS — L0211 Cutaneous abscess of neck: Secondary | ICD-10-CM | POA: Diagnosis not present

## 2021-04-18 DIAGNOSIS — L0211 Cutaneous abscess of neck: Secondary | ICD-10-CM | POA: Diagnosis not present

## 2021-05-03 ENCOUNTER — Telehealth: Payer: Self-pay | Admitting: Family Medicine

## 2021-05-03 NOTE — Telephone Encounter (Signed)
Pt requesting a refill of Adderall. Changed pharmacy to [facility] per her request.  Also, she has completed ppwk for referral to [place] and is awaiting their call for scheduling.

## 2021-05-04 MED ORDER — AMPHETAMINE-DEXTROAMPHETAMINE 10 MG PO TABS: 10.0000 mg | ORAL_TABLET | Freq: Every day | ORAL | 0 refills | Status: AC

## 2021-05-04 NOTE — Telephone Encounter (Signed)
Medicine sent. If [facility] does not contact you within 1 week of return in the paperwork my advice is to call them.

## 2021-05-04 NOTE — Telephone Encounter (Signed)
Called pt and relayed Dr. Corey's message.  Pt verbalizes understanding. 

## 2022-03-11 ENCOUNTER — Emergency Department (HOSPITAL_COMMUNITY)
Admission: EM | Admit: 2022-03-11 | Discharge: 2022-03-11 | Disposition: A | Discharge status: Home / Self Care | Payer: BC Managed Care – PPO | Attending: Emergency Medicine | Admitting: Emergency Medicine

## 2022-03-11 DIAGNOSIS — S80861A Insect bite (nonvenomous), right lower leg, initial encounter: Secondary | ICD-10-CM

## 2022-03-11 DIAGNOSIS — T63311A Toxic effect of venom of black widow spider, accidental (unintentional), initial encounter: Secondary | ICD-10-CM | POA: Insufficient documentation

## 2022-03-11 NOTE — ED Triage Notes (Addendum)
Patient here with complaint of spider bite, states she suddenly felt pain on her right calf, looked down, and saw a spider crawling out of her pant leg. Patient states pain has resolved now. Patient states the spider was definitely black but is unsure of what type.

## 2022-03-11 NOTE — ED Provider Notes (Signed)
[facility] EMERGENCY DEPARTMENT Provider Note   CSN: [id number] Arrival date & time: 03/11/22  1338     History  Chief Complaint  Patient presents with   Insect Bite    Kathleen Carney is a 46 y.o. female.  Patient is a 46 year old female presenting for spider bite.  Patient states she had long pants on and she felt something bite her in her right calf.  States she took her pants off and found a black spider running away.  Thought maybe she saw flash of red on the black spider.  She was concerned for a black widow spider bite.  Admitted to original redness and swelling around the puncture site however states it has resolved at this time.  Denies any pain in the calf, localized swelling, or bleeding.  The history is provided by the patient. No language interpreter was used.       Home Medications Prior to Admission medications   Medication Sig Start Date End Date Taking? Authorizing Provider  acetaminophen (TYLENOL) 500 MG tablet Take 1 tablet (500 mg total) by mouth every 6 (six) hours as needed for mild pain or headache. 08/21/20   Domenic Polite, MD  amphetamine-dextroamphetamine (ADDERALL) 10 MG tablet Take 1 tablet (10 mg total) by mouth daily with breakfast. 05/04/21   Gregor Hams, MD      Allergies    Polyethylene glycol    Review of Systems   Review of Systems  Constitutional:  Negative for chills and fever.  Respiratory:  Negative for cough and shortness of breath.   Cardiovascular:  Negative for chest pain and palpitations.  Gastrointestinal:  Negative for abdominal pain, nausea and vomiting.  Musculoskeletal:  Negative for back pain.  Skin:  Positive for wound. Negative for color change and rash.  All other systems reviewed and are negative.   Physical Exam Updated Vital Signs BP 120/89 (BP Location: Left Arm)   Pulse 91   Temp 99.1 F (37.3 C) (Oral)   Resp 16   SpO2 98%  Physical Exam Vitals and nursing note reviewed.   Constitutional:      Appearance: Normal appearance.  HENT:     Head: Normocephalic and atraumatic.  Cardiovascular:     Rate and Rhythm: Normal rate and regular rhythm.  Pulmonary:     Effort: Pulmonary effort is normal.     Breath sounds: Normal breath sounds.  Musculoskeletal:       Legs:  Skin:    Capillary Refill: Capillary refill takes less than 2 seconds.     Findings: No abscess, bruising, ecchymosis or rash.  Neurological:     Mental Status: She is alert.     ED Results / Procedures / Treatments   Labs (all labs ordered are listed, but only abnormal results are displayed) Labs Reviewed - No data to display  EKG None  Radiology No results found.  Procedures Procedures    Medications Ordered in ED Medications - No data to display  ED Course/ Medical Decision Making/ A&P                           Medical Decision Making  3:44 PM Is a 46 year old female presenting for insect bite.  Patient is alert and oriented x3, no acute distress, afebrile, so vital signs.  Physical Sam demonstrates < 1 centimeter puncture wound.  No localized erythema, swelling, warmth, drainage.  No calf tenderness on palpation.  Patient recommended for  Motrin for pain and ice for localized swelling and itching.  Patient states she feels well at this time and came in only because she thought it might of been a black widow spider bite however the surrounding redness has completely resolved.  She is recommended to return to emergency department for any worsening concerning signs or symptoms.  Recommendations on local wound care provided.  Patient in no distress and overall condition improved here in the ED. Detailed discussions were had with the patient regarding current findings, and need for close f/u with PCP or on call doctor. The patient has been instructed to return immediately if the symptoms worsen in any way for re-evaluation. Patient verbalized understanding and is in agreement with  current care plan. All questions answered prior to discharge.          Final Clinical Impression(s) / ED Diagnoses Final diagnoses:  Insect bite of right lower leg, initial encounter    Rx / DC Orders ED Discharge Orders     None         Franne Forts, DO 03/11/22 1542

## 2022-09-11 IMAGING — US US ABDOMEN LIMITED RUQ/ASCITES
1 series · 14 of 25 positions shown · non-contrast
Comparison: None.

CLINICAL DATA: Gallbladder colic.  Elevated LFTs.

EXAM:
ULTRASOUND ABDOMEN LIMITED RIGHT UPPER QUADRANT

[Series 1: us abdomen limited ruq (liver/gb) · 14 of 81 slices shown]
[im 1/81]
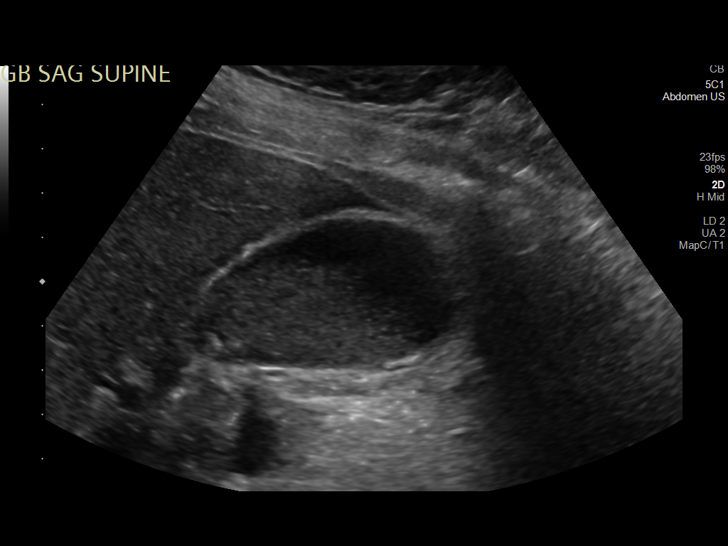
[im 7/81]
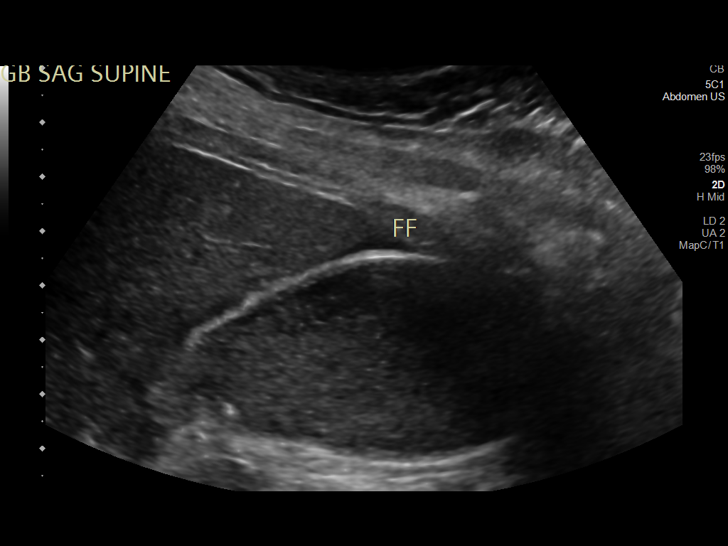
[im 14/81]
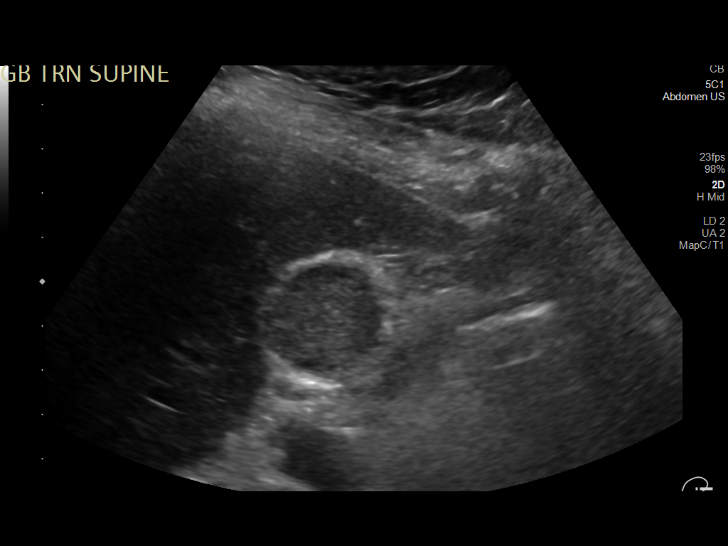
[im 21/81]
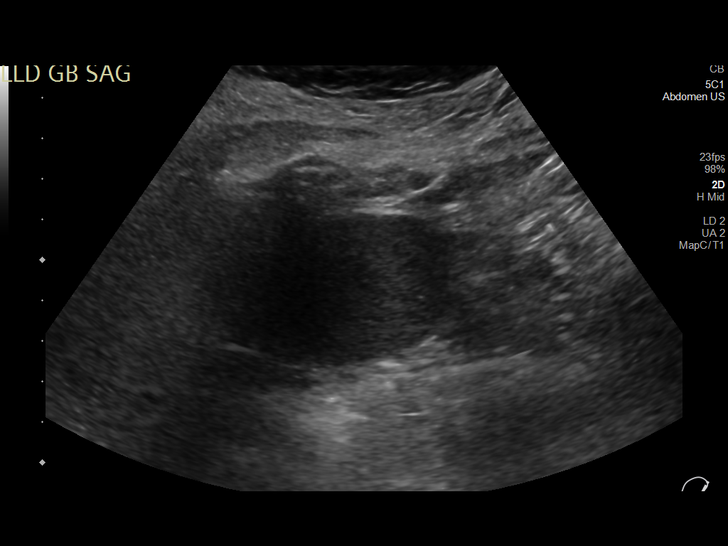
[im 27/81]
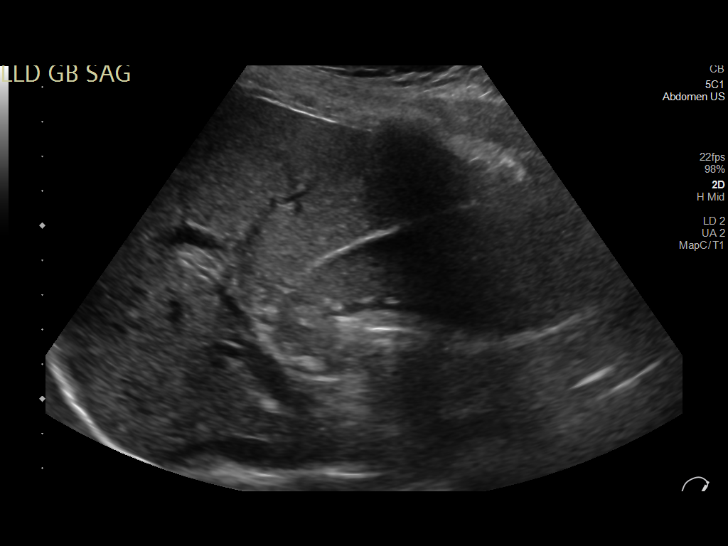
[im 31/81]
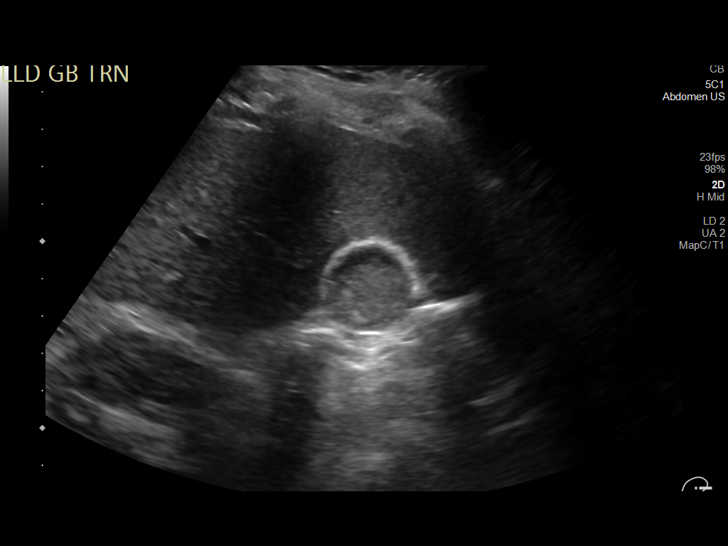
[im 37/81]
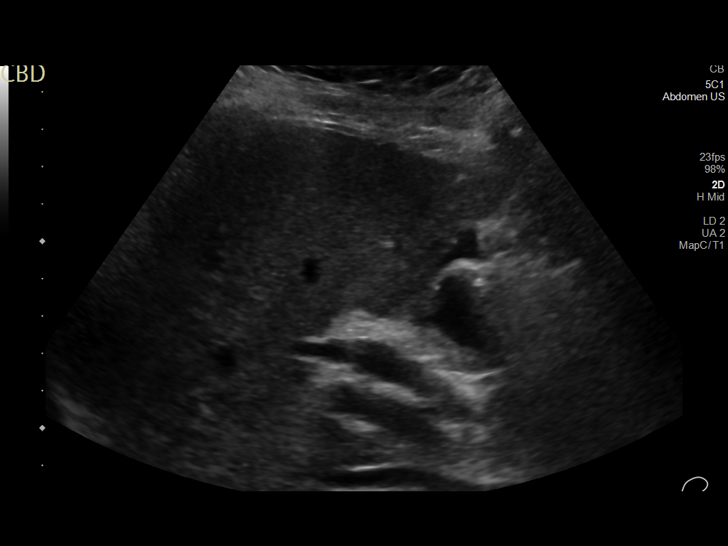
[im 44/81]
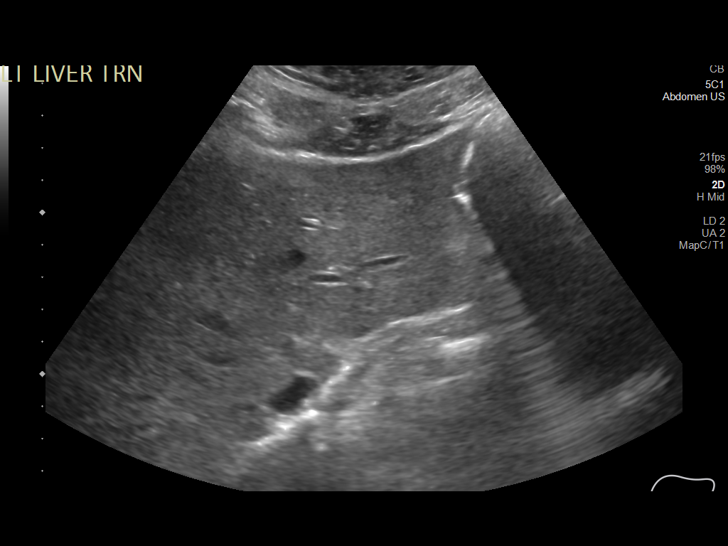
[im 51/81]
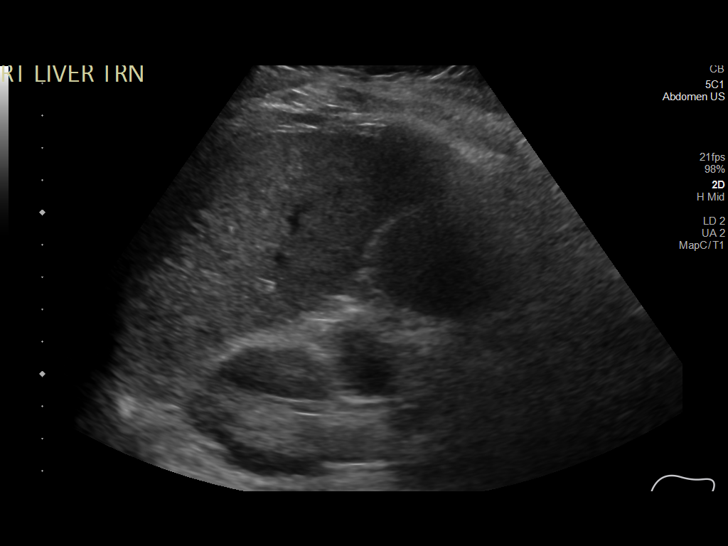
[im 54/81]
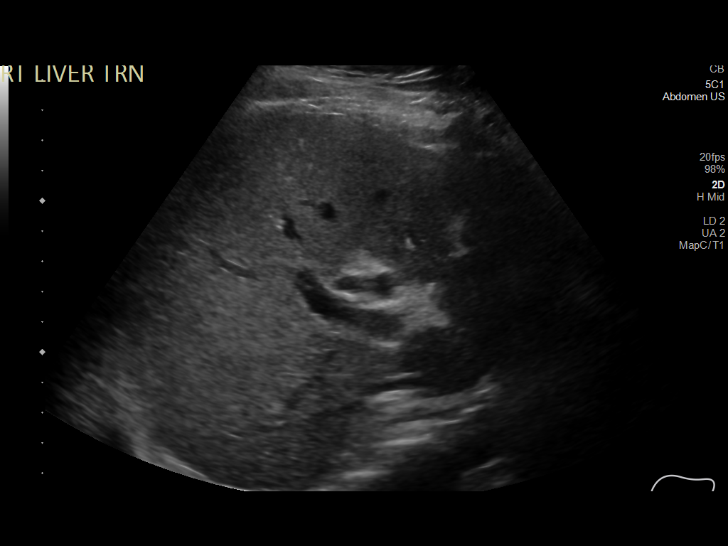
[im 61/81]
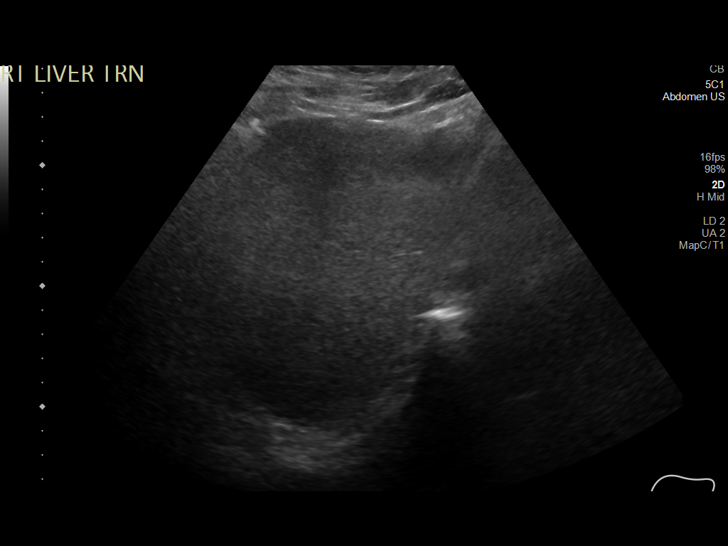
[im 67/81]
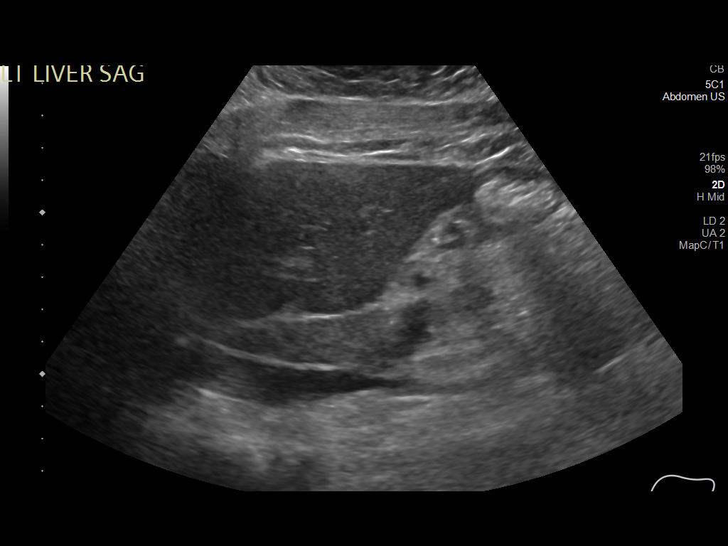
[im 74/81]
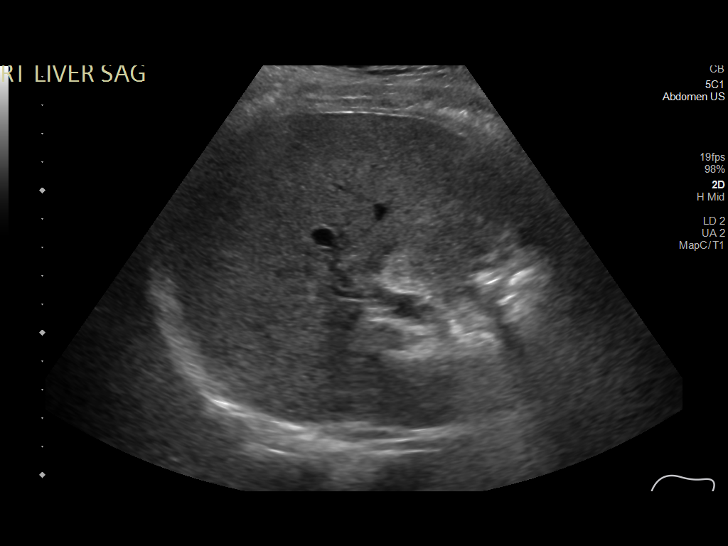
[im 81/81]
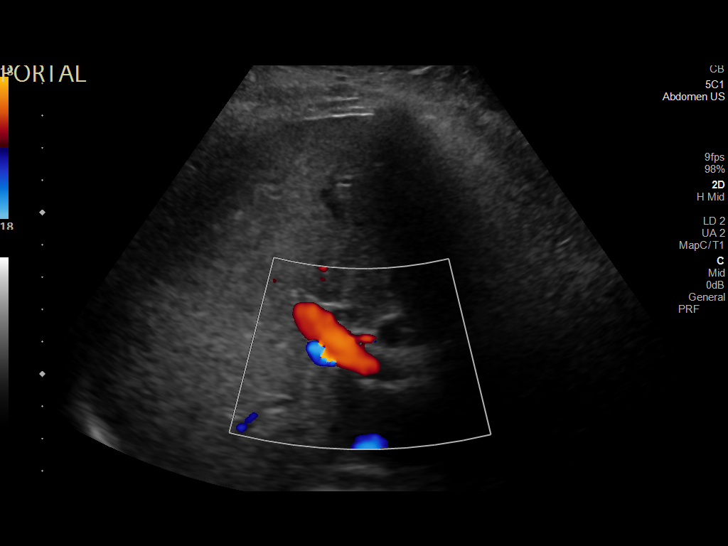

[14 of 25 positions shown; findings below may reference images not displayed]

FINDINGS: Gallbladder:

Distended containing intraluminal sludge and stones, as well as non
mobile gallstones in the gallbladder neck. Borderline wall thickness
of 3 mm. Trace pericholecystic fluid. No sonographic Murphy sign
noted by sonographer.

Common bile duct:

Diameter: 8-10 mm, no visualized choledocholithiasis.

Liver:

Heterogeneous and mildly increased compared to right kidney.
Probable geographic areas of steatosis. No discrete focal lesion.
Portal vein is patent on color Doppler imaging with normal direction
of blood flow towards the liver.

Other: None.
IMPRESSION: 1. Distended gallbladder containing sludge and stones, as well as a
non mobile stone in the gallbladder neck. Borderline wall thickness
of 3 mm with trace pericholecystic fluid. Findings may represent
acute cholecystitis in the appropriate clinical setting.
2. Dilated common bile duct at 8-10 mm, no visualized
choledocholithiasis.
3. Heterogeneous increased hepatic parenchymal echogenicity most
consistent with steatosis.

## 2022-09-11 IMAGING — CT CT ABD-PELV W/ CM
2 of 5 series · 16 of 46 positions shown, 18 images · IV contrast (Omni 300)
Comparison: Same day abdominal ultrasound

CLINICAL DATA: Abdominal pain since this a.m.

EXAM:
CT ABDOMEN AND PELVIS WITH CONTRAST
TECHNIQUE: Multidetector CT imaging of the abdomen and pelvis was performed
using the standard protocol following bolus administration of
intravenous contrast.
CONTRAST:  100mL OMNIPAQUE IOHEXOL 300 MG/ML  SOLN

[Series 3: a/p w/ 5mm · axial · 0.82mm/px · z∈[-372,-7]mm · 13 of 83 slices shown, 15 images]
[im 5/83  soft-tissue]
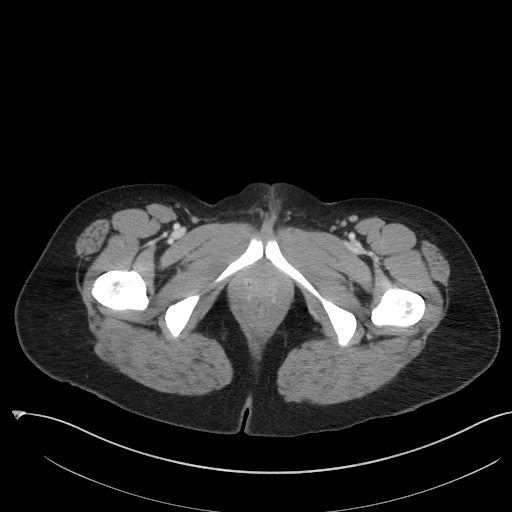
[im 5/83  bone]
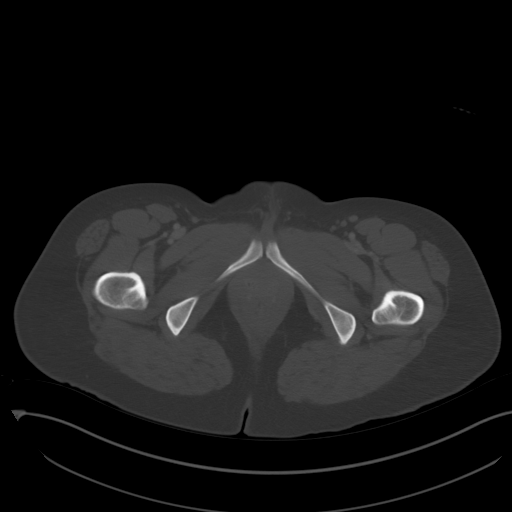
[im 13/83  soft-tissue]
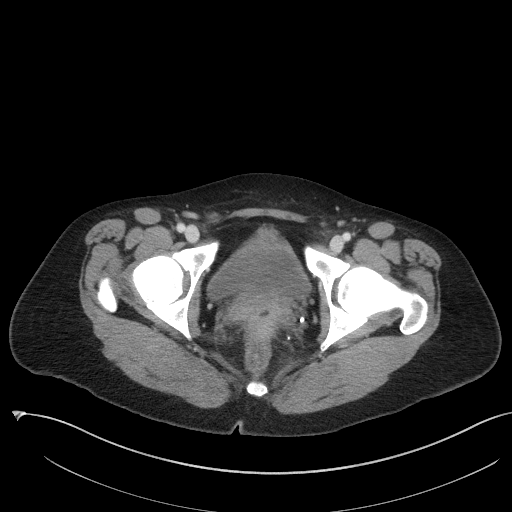
[im 17/83  soft-tissue]
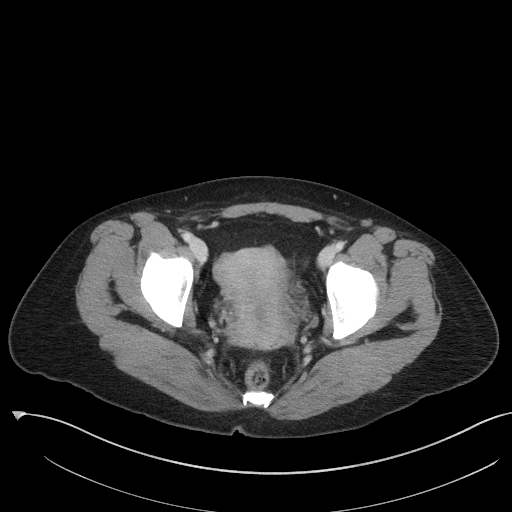
[im 25/83  soft-tissue]
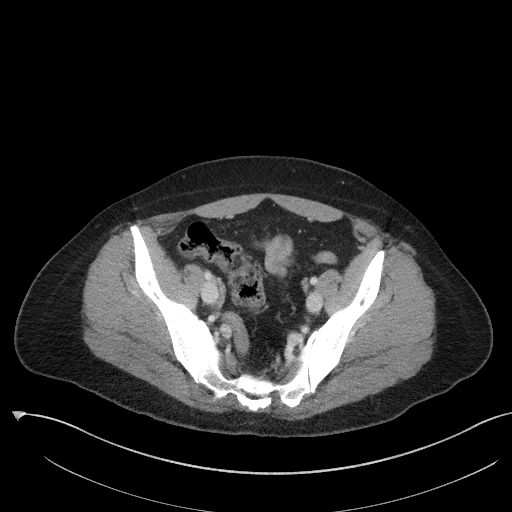
[im 29/83  soft-tissue]
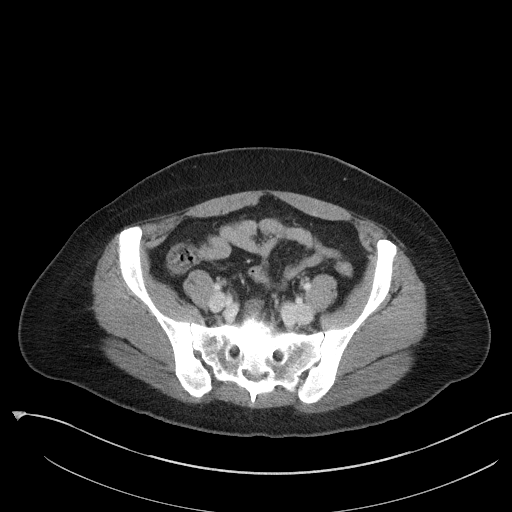
[im 37/83  soft-tissue]
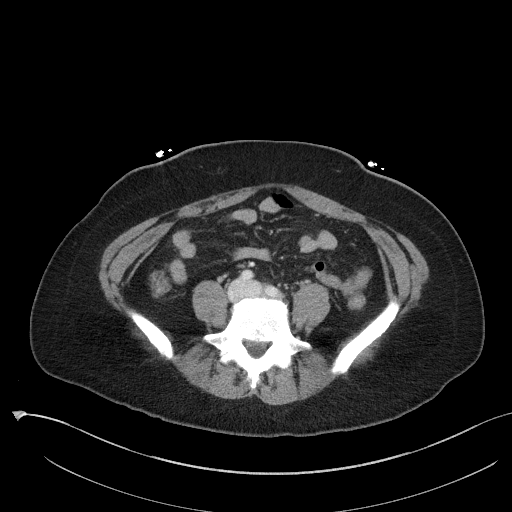
[im 42/83  soft-tissue]
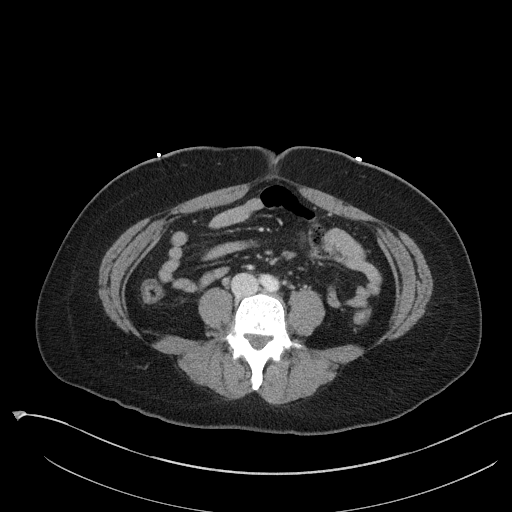
[im 46/83  soft-tissue]
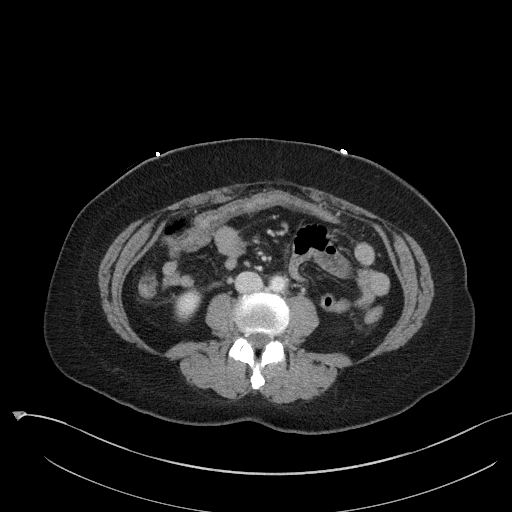
[im 54/83  soft-tissue]
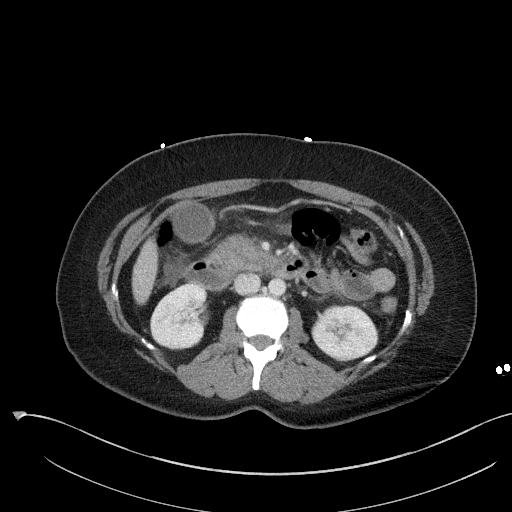
[im 54/83  bone]
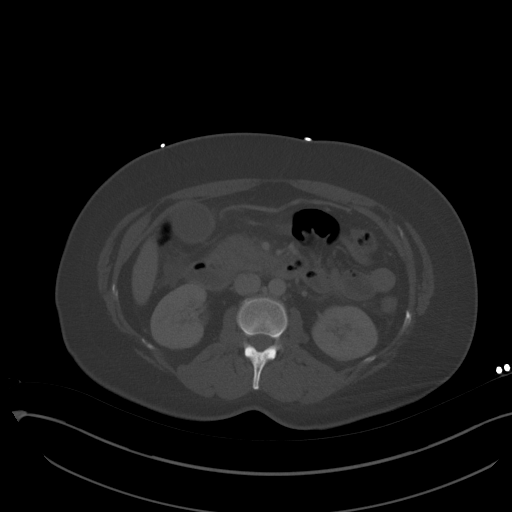
[im 58/83  soft-tissue]
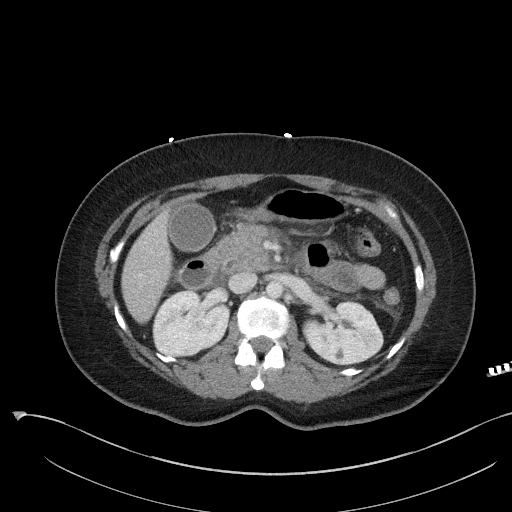
[im 66/83  soft-tissue]
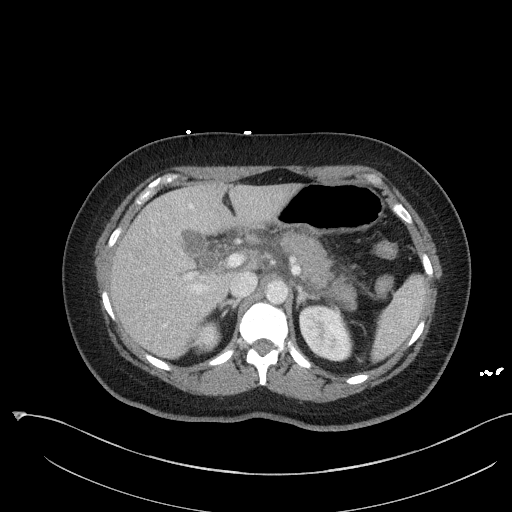
[im 70/83  soft-tissue]
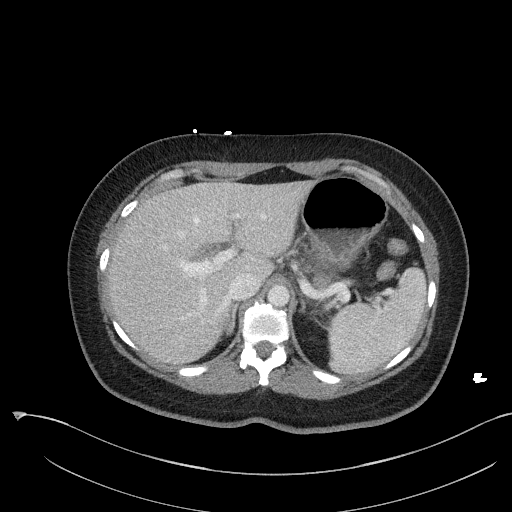
[im 78/83  soft-tissue]
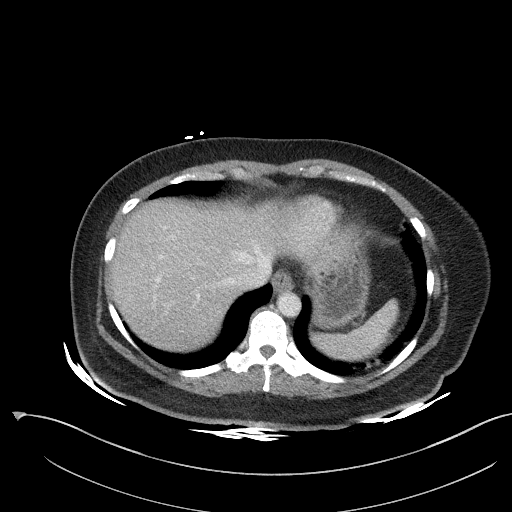

[Series 5: a/p w/ cor · coronal · 0.78mm/px · 3 of 129 slices shown]
[im 43/129  soft-tissue]
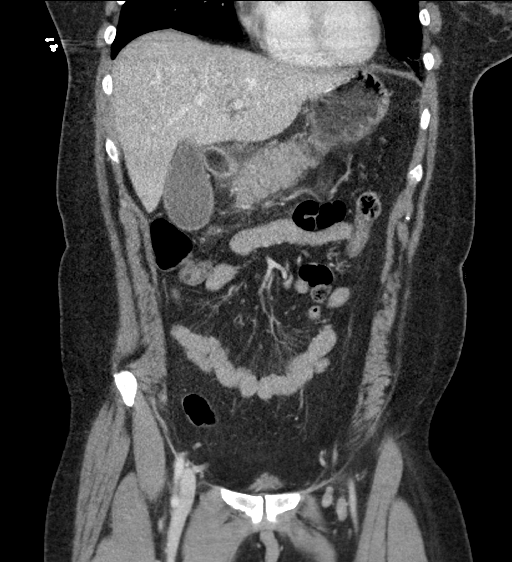
[im 57/129  soft-tissue]
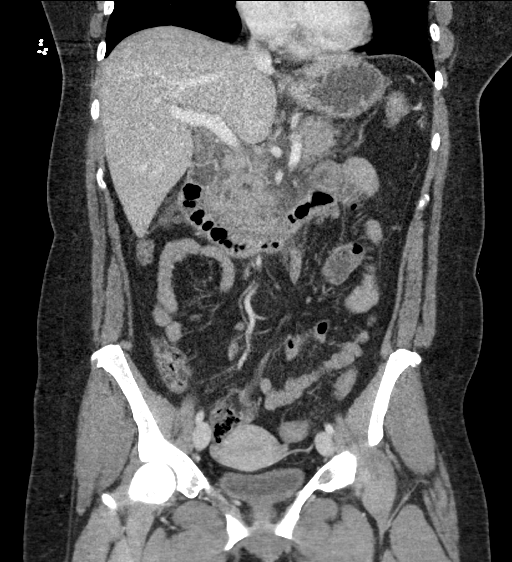
[im 72/129  soft-tissue]
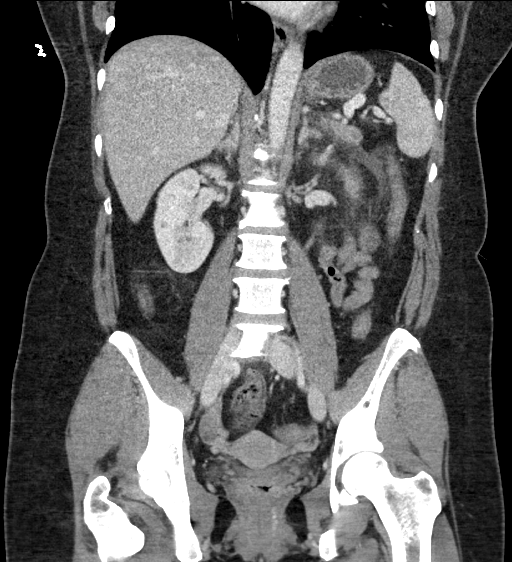

[16 of 46 positions shown; findings below may reference images not displayed]

FINDINGS: Lower chest: Dependent atelectasis. Normal size heart. No
significant pericardial effusion/thickening.

Hepatobiliary: Diffuse hepatic steatosis. No suspicious hepatic
lesion. Gallbladder is distended with wall thickening and
pericholecystic fluid. The common duct measures upper limits of
normal at 6 mm. No radiopaque choledocholithiasis visualized.

Pancreas: Edematous appearance of the pancreatic parenchyma with
peripancreatic stranding. No pancreatic ductal dilation. No focal
areas of pancreatic nonenhancement. No walled off collections.

Spleen: Normal in size without focal abnormality.

Adrenals/Urinary Tract: Adrenal glands are unremarkable. No
hydronephrosis. Tiny hypodense bilateral renal lesions which are
technically too small to accurately characterize but favored
represent cysts. No solid enhancing renal lesion. Bladder is
unremarkable for degree of distension.

Stomach/Bowel: Stomach is grossly unremarkable. Mild wall thickening
with submucosal enhancement and adjacent inflammatory stranding
along the first and second portion of the duodenum. No small bowel
dilation. The appendix is not definitely visualized however there is
no pericecal inflammation. The colon is predominately decompressed
without suspicious wall thickening or mass like lesions.

Vascular/Lymphatic: No significant vascular findings are present. No
enlarged abdominal or pelvic lymph nodes.

Reproductive: Uterus and bilateral adnexa are unremarkable.

Other: No walled off fluid collections.  No pneumoperitoneum.

Musculoskeletal: L5-S1 discogenic disease. No acute osseous
abnormality.
IMPRESSION: 1. Acute interstitial pancreatitis. No walled off fluid collections,
evidence of pancreatic necrosis or pancreatic ductal dilation.
2. Distended gallbladder with wall thickening and pericholecystic
fluid, suspicious for acute cholecystitis in the appropriate
clinical setting.
3. Common bile duct measures upper limits of normal at 6 mm no
intrahepatic biliary ductal dilation. No radiopaque
choledocholithiasis visualized.
4. Mild wall thickening with submucosal enhancement and adjacent
inflammatory stranding along the first and second portion of the
duodenum, likely reactive.
5. Diffuse hepatic steatosis.
